# Patient Record
Sex: Female | Born: 1970 | Race: White | Hispanic: No | Marital: Married | Smoking: Never smoker
Health system: Southern US, Community
[De-identification: ages and names within clinical notes are randomized; demographics above are authoritative.]

## PROBLEM LIST (undated history)

## (undated) DIAGNOSIS — N912 Amenorrhea, unspecified: Secondary | ICD-10-CM

## (undated) DIAGNOSIS — M1990 Unspecified osteoarthritis, unspecified site: Secondary | ICD-10-CM

## (undated) DIAGNOSIS — N390 Urinary tract infection, site not specified: Secondary | ICD-10-CM

## (undated) DIAGNOSIS — J302 Other seasonal allergic rhinitis: Secondary | ICD-10-CM

## (undated) DIAGNOSIS — I809 Phlebitis and thrombophlebitis of unspecified site: Secondary | ICD-10-CM

## (undated) DIAGNOSIS — Z91018 Allergy to other foods: Secondary | ICD-10-CM

## (undated) DIAGNOSIS — F329 Major depressive disorder, single episode, unspecified: Secondary | ICD-10-CM

## (undated) DIAGNOSIS — K635 Polyp of colon: Secondary | ICD-10-CM

## (undated) DIAGNOSIS — J45909 Unspecified asthma, uncomplicated: Secondary | ICD-10-CM

## (undated) DIAGNOSIS — Z8619 Personal history of other infectious and parasitic diseases: Secondary | ICD-10-CM

## (undated) DIAGNOSIS — E079 Disorder of thyroid, unspecified: Secondary | ICD-10-CM

## (undated) DIAGNOSIS — F32A Depression, unspecified: Secondary | ICD-10-CM

## (undated) DIAGNOSIS — J301 Allergic rhinitis due to pollen: Secondary | ICD-10-CM

## (undated) DIAGNOSIS — R51 Headache: Secondary | ICD-10-CM

## (undated) DIAGNOSIS — U099 Post covid-19 condition, unspecified: Secondary | ICD-10-CM

## (undated) DIAGNOSIS — R519 Headache, unspecified: Secondary | ICD-10-CM

## (undated) HISTORY — DX: Amenorrhea, unspecified: N91.2

## (undated) HISTORY — DX: Depression, unspecified: F32.A

## (undated) HISTORY — DX: Allergy to other foods: Z91.018

## (undated) HISTORY — DX: Other seasonal allergic rhinitis: J30.2

## (undated) HISTORY — DX: Personal history of other infectious and parasitic diseases: Z86.19

## (undated) HISTORY — PX: ENDOMETRIAL ABLATION: SHX621

## (undated) HISTORY — PX: TUBAL LIGATION: SHX77

## (undated) HISTORY — PX: COLPOSCOPY: SHX161

## (undated) HISTORY — DX: Unspecified osteoarthritis, unspecified site: M19.90

## (undated) HISTORY — PX: BREAST EXCISIONAL BIOPSY: SUR124

## (undated) HISTORY — DX: Unspecified asthma, uncomplicated: J45.909

## (undated) HISTORY — DX: Polyp of colon: K63.5

## (undated) HISTORY — DX: Allergic rhinitis due to pollen: J30.1

## (undated) HISTORY — DX: Post covid-19 condition, unspecified: U09.9

## (undated) HISTORY — DX: Phlebitis and thrombophlebitis of unspecified site: I80.9

## (undated) HISTORY — DX: Headache: R51

## (undated) HISTORY — DX: Urinary tract infection, site not specified: N39.0

## (undated) HISTORY — DX: Disorder of thyroid, unspecified: E07.9

## (undated) HISTORY — DX: Headache, unspecified: R51.9

---
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
## 1993-07-11 HISTORY — PX: BREAST BIOPSY: SHX20

## 1997-07-11 DIAGNOSIS — I82401 Acute embolism and thrombosis of unspecified deep veins of right lower extremity: Secondary | ICD-10-CM

## 1997-07-11 HISTORY — DX: Acute embolism and thrombosis of unspecified deep veins of right lower extremity: I82.401

## 1998-10-16 DIAGNOSIS — I82890 Acute embolism and thrombosis of other specified veins: Secondary | ICD-10-CM | POA: Insufficient documentation

## 1998-10-16 DIAGNOSIS — N6019 Diffuse cystic mastopathy of unspecified breast: Secondary | ICD-10-CM | POA: Insufficient documentation

## 2001-07-11 HISTORY — PX: CERVICAL DISCECTOMY: SHX98

## 2011-07-12 HISTORY — PX: CHOLECYSTECTOMY: SHX55

## 2012-11-14 DIAGNOSIS — Z9889 Other specified postprocedural states: Secondary | ICD-10-CM | POA: Insufficient documentation

## 2012-11-14 DIAGNOSIS — F419 Anxiety disorder, unspecified: Secondary | ICD-10-CM | POA: Insufficient documentation

## 2012-11-14 DIAGNOSIS — G43909 Migraine, unspecified, not intractable, without status migrainosus: Secondary | ICD-10-CM | POA: Insufficient documentation

## 2014-07-03 DIAGNOSIS — F418 Other specified anxiety disorders: Secondary | ICD-10-CM | POA: Insufficient documentation

## 2014-07-03 DIAGNOSIS — M5481 Occipital neuralgia: Secondary | ICD-10-CM | POA: Insufficient documentation

## 2014-07-03 DIAGNOSIS — G252 Other specified forms of tremor: Secondary | ICD-10-CM | POA: Insufficient documentation

## 2014-07-03 DIAGNOSIS — J45909 Unspecified asthma, uncomplicated: Secondary | ICD-10-CM | POA: Insufficient documentation

## 2014-07-17 DIAGNOSIS — B0052 Herpesviral keratitis: Secondary | ICD-10-CM | POA: Insufficient documentation

## 2015-01-09 DIAGNOSIS — H16149 Punctate keratitis, unspecified eye: Secondary | ICD-10-CM | POA: Insufficient documentation

## 2015-07-09 DIAGNOSIS — Z302 Encounter for sterilization: Secondary | ICD-10-CM | POA: Insufficient documentation

## 2016-07-11 HISTORY — PX: THYROIDECTOMY: SHX17

## 2016-10-10 DIAGNOSIS — J4520 Mild intermittent asthma, uncomplicated: Secondary | ICD-10-CM | POA: Insufficient documentation

## 2016-10-10 DIAGNOSIS — G43009 Migraine without aura, not intractable, without status migrainosus: Secondary | ICD-10-CM | POA: Insufficient documentation

## 2016-10-11 LAB — BASIC METABOLIC PANEL
BUN: 12 (ref 4–21)
Creatinine: 0.8 (ref 0.5–1.1)
Glucose: 83
SODIUM: 138 (ref 137–147)

## 2016-10-11 LAB — HEPATIC FUNCTION PANEL
ALK PHOS: 64 (ref 25–125)
BILIRUBIN, TOTAL: 0.6

## 2016-10-11 LAB — TSH: TSH: 3.06 (ref 0.41–5.90)

## 2017-01-15 DIAGNOSIS — D440 Neoplasm of uncertain behavior of thyroid gland: Secondary | ICD-10-CM | POA: Insufficient documentation

## 2017-02-03 DIAGNOSIS — E038 Other specified hypothyroidism: Secondary | ICD-10-CM | POA: Insufficient documentation

## 2017-02-03 DIAGNOSIS — E041 Nontoxic single thyroid nodule: Secondary | ICD-10-CM | POA: Insufficient documentation

## 2017-08-10 LAB — CBC AND DIFFERENTIAL
Neutrophils Absolute: 5
WBC: 6.7

## 2018-04-13 ENCOUNTER — Encounter: Payer: Self-pay | Admitting: Family Medicine

## 2018-04-13 ENCOUNTER — Ambulatory Visit (INDEPENDENT_AMBULATORY_CARE_PROVIDER_SITE_OTHER): Payer: BC Managed Care – PPO | Admitting: Family Medicine

## 2018-04-13 VITALS — BP 124/74 | HR 74 | Temp 98.0°F | Ht 69.0 in | Wt 163.6 lb

## 2018-04-13 DIAGNOSIS — Z8709 Personal history of other diseases of the respiratory system: Secondary | ICD-10-CM | POA: Diagnosis not present

## 2018-04-13 DIAGNOSIS — G43909 Migraine, unspecified, not intractable, without status migrainosus: Secondary | ICD-10-CM

## 2018-04-13 DIAGNOSIS — Z23 Encounter for immunization: Secondary | ICD-10-CM

## 2018-04-13 DIAGNOSIS — F325 Major depressive disorder, single episode, in full remission: Secondary | ICD-10-CM

## 2018-04-13 DIAGNOSIS — E039 Hypothyroidism, unspecified: Secondary | ICD-10-CM | POA: Diagnosis not present

## 2018-04-13 MED ORDER — BUPROPION HCL ER (XL) 300 MG PO TB24: 300.0000 mg | ORAL_TABLET | Freq: Every day | ORAL | 5 refills | Status: DC

## 2018-04-13 MED ORDER — TOPIRAMATE 25 MG PO TABS: 25.0000 mg | ORAL_TABLET | Freq: Three times a day (TID) | ORAL | 5 refills | Status: DC

## 2018-04-13 MED ORDER — SUMATRIPTAN SUCCINATE 100 MG PO TABS: 100.0000 mg | ORAL_TABLET | Freq: Every day | ORAL | 0 refills | Status: DC | PRN

## 2018-04-13 NOTE — Assessment & Plan Note (Signed)
Stable.  Wellbutrin refilled today.

## 2018-04-13 NOTE — Assessment & Plan Note (Signed)
Stable.  Does not need refill on albuterol today.

## 2018-04-13 NOTE — Progress Notes (Signed)
Subjective:  Carrie [name] is a 47 y.o. female who presents today with a chief complaint of migraine disorder.   HPI:  Migraine disorder, new problem provider Several year history.  Currently on Topamax 25 mg 3 times daily.  Also takes Imitrex as needed.  She takes this few times per month.  Does not currently have a headache.  Symptoms seem to be well controlled.  No reported weakness or numbness.  Hypothyroidism, new problem to provider Currently takes Armour Thyroid.  This is prescribed to her by her surgeon who performed parathyroidectomy.  Symptoms seem to be well controlled.  Depression, new problem to provider Several year history.  Currently on Wellbutrin 300 mg daily.  Symptoms are well controlled.  Depression screen PHQ 2/9 04/13/2018  Decreased Interest 0  Down, Depressed, Hopeless 0  PHQ - 2 Score 0  Altered sleeping 1  Tired, decreased energy 1  Change in appetite 0  Feeling bad or failure about yourself  0  Trouble concentrating 0  Moving slowly or fidgety/restless 0  Suicidal thoughts 0  PHQ-9 Score 2  Difficult doing work/chores Not difficult at all   Asthma, chronic problem, new to provider Currently stable.  Occasionally takes albuterol as needed.  ROS: Per HPI, otherwise a complete review of systems was negative.   PMH:  The following were reviewed and entered/updated in epic: Past Medical History:  Diagnosis Date  . Arthritis   . Asthma   . Colon polyps   . Frequent headaches   . Frequent refractory urinary tract infections   . Hay fever   . History of chicken pox   . Thyroid disease    Patient Active Problem List   Diagnosis Date Noted  . Migraines 04/13/2018  . Hypothyroidism 04/13/2018  . 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 History of asthma 04/13/2018   Past Surgical History:  Procedure Laterality Date  . ADENOIDECTOMY  1975  . BREAST BIOPSY  1995  . CERVICAL DISCECTOMY  2003  . CHOLECYSTECTOMY  2013  .  THYROIDECTOMY  2018    Family History  Problem Relation Age of Onset  . Depression Mother   . Pancreatitis Mother   . Alcohol abuse Father   . Depression Father   . Diabetes Father   . Early death Father   . Heart attack Father   . Heart disease Father   . Pancreatitis Father   . Depression Sister   . Hyperlipidemia Sister   . Mental illness Sister   . Mental illness Daughter   . Migraines Daughter   . Depression Maternal Grandmother   . Early death Maternal Grandmother   . Hyperlipidemia Maternal Grandmother   . Hypertension Maternal Grandmother   . Stroke Maternal Grandmother   . Learning disabilities Maternal Grandmother   . Depression Maternal Grandfather   . Early death Maternal Grandfather   . Alcohol abuse Maternal Grandfather   . Learning disabilities Maternal Grandfather   . Epilepsy Maternal Grandfather   . Suicidality Maternal Grandfather   . Arthritis Paternal Grandmother   . Cancer Paternal Grandmother   . Hearing loss Paternal Grandmother   . Alcohol abuse Paternal Grandfather   . Hearing loss Paternal Grandfather   . Heart disease Paternal Grandfather   . Depression Sister   . Depression Sister     Medications- reviewed and updated Current Outpatient Medications  Medication Sig Dispense Refill  . ARMOUR THYROID 120 MG tablet Take 120 mg by mouth daily.  0  .  ARMOUR THYROID 15 MG tablet Take 15 mg by mouth daily.  0  . buPROPion (WELLBUTRIN XL) 300 MG 24 hr tablet Take 1 tablet (300 mg total) by mouth daily. 30 tablet 5  . Multiple Vitamin (MULTIVITAMIN) tablet Take 1 tablet by mouth daily.    . SUMAtriptan (IMITREX) 100 MG tablet Take 1 tablet (100 mg total) by mouth daily as needed. 10 tablet 0  . topiramate (TOPAMAX) 25 MG tablet Take 1 tablet (25 mg total) by mouth 3 (three) times daily. 90 tablet 5   No current facility-administered medications for this visit.     Allergies-reviewed and updated Allergies  Allergen Reactions  . Gluten Meal     . Tamiflu [Oseltamivir Phosphate]   . Zofran [Ondansetron]     Social History   Socioeconomic History  . Marital status: Not on file    Spouse name: Not on file  . Number of children: Not on file  . Years of education: Not on file  . Highest education level: Not on file  Occupational History  . Not on file  Social Needs  . Financial resource strain: Not on file  . Food insecurity:    Worry: Not on file    Inability: Not on file  . Transportation needs:    Medical: Not on file    Non-medical: Not on file  Tobacco Use  . Smoking status: Never Smoker  . Smokeless tobacco: Never Used  Substance and Sexual Activity  . Alcohol use: Yes  . Drug use: Never  . Sexual activity: Yes    Partners: Male  Lifestyle  . Physical activity:    Days per week: Not on file    Minutes per session: Not on file  . Stress: Not on file  Relationships  . Social connections:    Talks on phone: Not on file    Gets together: Not on file    Attends religious service: Not on file    Active member of club or organization: Not on file    Attends meetings of clubs or organizations: Not on file    Relationship status: Not on file  Other Topics Concern  . Not on file  Social History Narrative  . Not on file     Objective:  Physical Exam: BP 124/74 (BP Location: Left Arm, Patient Position: Sitting, Cuff Size: Normal)   Pulse 74   Temp 98 F (36.7 C) (Oral)   Ht 5' 9"  (1.753 m)   Wt 163 lb 9.6 oz (74.2 kg)   SpO2 98%   BMI 24.16 kg/m   Gen: NAD, resting comfortably CV: RRR with no murmurs appreciated Pulm: NWOB, CTAB with no crackles, wheezes, or rhonchi GI: Normal bowel sounds present. Soft, Nontender, Nondistended. MSK: No edema, cyanosis, or clubbing noted Skin: Warm, dry Neuro: Grossly normal, moves all extremities Psych: Normal affect and thought content  Assessment/Plan:  Migraines Stable.  We will refill her Topamax and Imitrex today.  Hypothyroidism Stable.  Defer further  management to her surgeon who is prescribing her Armour Thyroid.  Major depression in remission ([place]) Stable.  Wellbutrin refilled today.  History of asthma Stable.  Does not need refill on albuterol today.  Preventative Healthcare Patient was instructed to return soon for CPE.  Flu shot given today. Health Maintenance Due  Topic Date Due  . HIV Screening  03/10/1987  . TETANUS/TDAP  03/10/1991  . PAP SMEAR  03/09/1993   Caleb M. [name], MD 04/13/2018 5:13 PM

## 2018-04-13 NOTE — Assessment & Plan Note (Signed)
Stable.  We will refill her Topamax and Imitrex today.

## 2018-04-13 NOTE — Assessment & Plan Note (Signed)
Stable.  Defer further management to her surgeon who is prescribing her Armour Thyroid.

## 2018-04-13 NOTE — Patient Instructions (Signed)
It was very nice to see you today!  I will refill your wellbutrin, topamax, and imitrex.  No changes today.   We will give you your flu shot today.  Come back to see me soon for your physical with blood work.   Take care, Dr [name]

## 2018-04-20 ENCOUNTER — Ambulatory Visit (INDEPENDENT_AMBULATORY_CARE_PROVIDER_SITE_OTHER): Payer: BC Managed Care – PPO | Admitting: Family Medicine

## 2018-04-20 ENCOUNTER — Encounter: Payer: Self-pay | Admitting: Family Medicine

## 2018-04-20 VITALS — BP 120/72 | HR 78 | Temp 98.4°F | Ht 69.0 in | Wt 162.4 lb

## 2018-04-20 DIAGNOSIS — G43909 Migraine, unspecified, not intractable, without status migrainosus: Secondary | ICD-10-CM | POA: Diagnosis not present

## 2018-04-20 DIAGNOSIS — I83813 Varicose veins of bilateral lower extremities with pain: Secondary | ICD-10-CM | POA: Diagnosis not present

## 2018-04-20 DIAGNOSIS — Z0001 Encounter for general adult medical examination with abnormal findings: Secondary | ICD-10-CM | POA: Diagnosis not present

## 2018-04-20 DIAGNOSIS — Z13220 Encounter for screening for lipoid disorders: Secondary | ICD-10-CM | POA: Diagnosis not present

## 2018-04-20 LAB — LIPID PANEL
CHOLESTEROL: 189 mg/dL (ref <200)
HDL: 52 mg/dL (ref >50)
LDL Cholesterol (Calc): 120 mg/dL (calc) — ABNORMAL HIGH
NON-HDL CHOLESTEROL (CALC): 137 mg/dL — AB (ref <130)
Total CHOL/HDL Ratio: 3.6 (calc) (ref <5.0)
Triglycerides: 71 mg/dL (ref <150)

## 2018-04-20 LAB — COMPREHENSIVE METABOLIC PANEL
AG RATIO: 1.9 (calc) (ref 1.0–2.5)
ALKALINE PHOSPHATASE (APISO): 74 U/L (ref 33–115)
ALT: 18 U/L (ref 6–29)
AST: 14 U/L (ref 10–35)
Albumin: 4.1 g/dL (ref 3.6–5.1)
BUN: 18 mg/dL (ref 7–25)
CHLORIDE: 107 mmol/L (ref 98–110)
CO2: 24 mmol/L (ref 20–32)
Calcium: 8.8 mg/dL (ref 8.6–10.2)
Creat: 0.87 mg/dL (ref 0.50–1.10)
GLOBULIN: 2.2 g/dL (ref 1.9–3.7)
Glucose, Bld: 80 mg/dL (ref 65–99)
POTASSIUM: 4.2 mmol/L (ref 3.5–5.3)
Sodium: 138 mmol/L (ref 135–146)
Total Bilirubin: 0.7 mg/dL (ref 0.2–1.2)
Total Protein: 6.3 g/dL (ref 6.1–8.1)

## 2018-04-20 LAB — CBC
HEMATOCRIT: 40.6 % (ref 35.0–45.0)
HEMOGLOBIN: 14 g/dL (ref 11.7–15.5)
MCH: 31.2 pg (ref 27.0–33.0)
MCHC: 34.5 g/dL (ref 32.0–36.0)
MCV: 90.4 fL (ref 80.0–100.0)
MPV: 9.8 fL (ref 7.5–12.5)
Platelets: 277 10*3/uL (ref 140–400)
RBC: 4.49 10*6/uL (ref 3.80–5.10)
RDW: 11.8 % (ref 11.0–15.0)
WBC: 5.2 10*3/uL (ref 3.8–10.8)

## 2018-04-20 NOTE — Progress Notes (Signed)
Subjective:  Carrie [name] is a 47 y.o. female who presents today for her annual comprehensive physical exam.    HPI:  She has no acute complaints today.   Her chronic, stable conditions are outlined below:  1. Migraines.  Stable on Topamax 25 mg 3 times daily.  Also takes Imitrex as needed. 2.  Depression.  Stable on Wellbutrin 300 mg daily. 3.  Varicose veins.  Several year history.  Worse with standing.  Would like referral for further evaluation today.  Occasionally painful.  Lifestyle Diet: No specific diets. Tries to avoid gluten.  Exercise: Does yoga every day. Likes to hike and run.   Depression screen PHQ 2/9 04/13/2018  Decreased Interest 0  Down, Depressed, Hopeless 0  PHQ - 2 Score 0  Altered sleeping 1  Tired, decreased energy 1  Change in appetite 0  Feeling bad or failure about yourself  0  Trouble concentrating 0  Moving slowly or fidgety/restless 0  Suicidal thoughts 0  PHQ-9 Score 2  Difficult doing work/chores Not difficult at all    Health Maintenance Due  Topic Date Due  . HIV Screening  03/10/1987  . TETANUS/TDAP  03/10/1991  . PAP SMEAR  03/09/1993     ROS: Per HPI, otherwise a complete review of systems was negative.   PMH:  The following were reviewed and entered/updated in epic: Past Medical History:  Diagnosis Date  . Arthritis   . Asthma   . Colon polyps   . Frequent headaches   . Frequent refractory urinary tract infections   . Hay fever   . History of chicken pox   . Thyroid disease    Patient Active Problem List   Diagnosis Date Noted  . Varicose veins of both lower extremities with pain 04/20/2018  . Migraines 04/13/2018  . Hypothyroidism 04/13/2018  . 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 History of asthma 04/13/2018   Past Surgical History:  Procedure Laterality Date  . ADENOIDECTOMY  1975  . BREAST BIOPSY  1995  . CERVICAL DISCECTOMY  2003  . CHOLECYSTECTOMY  2013  . THYROIDECTOMY  2018     Family History  Problem Relation Age of Onset  . Depression Mother   . Pancreatitis Mother   . Alcohol abuse Father   . Depression Father   . Diabetes Father   . Early death Father   . Heart attack Father   . Heart disease Father   . Pancreatitis Father   . Depression Sister   . Hyperlipidemia Sister   . Mental illness Sister   . Mental illness Daughter   . Migraines Daughter   . Depression Maternal Grandmother   . Early death Maternal Grandmother   . Hyperlipidemia Maternal Grandmother   . Hypertension Maternal Grandmother   . Stroke Maternal Grandmother   . Learning disabilities Maternal Grandmother   . Depression Maternal Grandfather   . Early death Maternal Grandfather   . Alcohol abuse Maternal Grandfather   . Learning disabilities Maternal Grandfather   . Epilepsy Maternal Grandfather   . Suicidality Maternal Grandfather   . Arthritis Paternal Grandmother   . Cancer Paternal Grandmother   . Hearing loss Paternal Grandmother   . Alcohol abuse Paternal Grandfather   . Hearing loss Paternal Grandfather   . Heart disease Paternal Grandfather   . Depression Sister   . Depression Sister     Medications- reviewed and updated Current Outpatient Medications  Medication Sig Dispense Refill  . ARMOUR THYROID 120  MG tablet Take 120 mg by mouth daily.  0  . ARMOUR THYROID 15 MG tablet Take 15 mg by mouth daily.  0  . buPROPion (WELLBUTRIN XL) 300 MG 24 hr tablet Take 1 tablet (300 mg total) by mouth daily. 30 tablet 5  . Multiple Vitamin (MULTIVITAMIN) tablet Take 1 tablet by mouth daily.    . SUMAtriptan (IMITREX) 100 MG tablet Take 1 tablet (100 mg total) by mouth daily as needed. 10 tablet 0  . topiramate (TOPAMAX) 25 MG tablet Take 1 tablet (25 mg total) by mouth 3 (three) times daily. 90 tablet 5   No current facility-administered medications for this visit.     Allergies-reviewed and updated Allergies  Allergen Reactions  . Gluten Meal   . Tamiflu  [Oseltamivir Phosphate]   . Zofran [Ondansetron]     Social History   Socioeconomic History  . Marital status: Married    Spouse name: Not on file  . Number of children: Not on file  . Years of education: Not on file  . Highest education level: Not on file  Occupational History  . Not on file  Social Needs  . Financial resource strain: Not on file  . Food insecurity:    Worry: Not on file    Inability: Not on file  . Transportation needs:    Medical: Not on file    Non-medical: Not on file  Tobacco Use  . Smoking status: Never Smoker  . Smokeless tobacco: Never Used  Substance and Sexual Activity  . Alcohol use: Yes  . Drug use: Never  . Sexual activity: Yes    Partners: Male  Lifestyle  . Physical activity:    Days per week: Not on file    Minutes per session: Not on file  . Stress: Not on file  Relationships  . Social connections:    Talks on phone: Not on file    Gets together: Not on file    Attends religious service: Not on file    Active member of club or organization: Not on file    Attends meetings of clubs or organizations: Not on file    Relationship status: Not on file  Other Topics Concern  . Not on file  Social History Narrative  . Not on file    Objective:  Physical Exam: BP 120/72 (BP Location: Left Arm, Patient Position: Sitting, Cuff Size: Normal)   Pulse 78   Temp 98.4 F (36.9 C) (Oral)   Ht 5' 9"  (1.753 m)   Wt 162 lb 6.4 oz (73.7 kg)   SpO2 98%   BMI 23.98 kg/m   Body mass index is 23.98 kg/m. Wt Readings from Last 3 Encounters:  04/20/18 162 lb 6.4 oz (73.7 kg)  04/13/18 163 lb 9.6 oz (74.2 kg)   Gen: NAD, resting comfortably HEENT: TMs normal bilaterally. OP clear. No thyromegaly noted.  CV: RRR with no murmurs appreciated Pulm: NWOB, CTAB with no crackles, wheezes, or rhonchi GI: Normal bowel sounds present. Soft, Nontender, Nondistended. MSK: no edema, cyanosis, or clubbing noted.  Varicose veins and spider veins noted  bilateral lower extremities. Skin: warm, dry Neuro: CN2-12 grossly intact. Strength 5/5 in upper and lower extremities. Reflexes symmetric and intact bilaterally.  Psych: Normal affect and thought content  Assessment/Plan:  Migraines Stable.  Check CBC and CMET.  Continue Topamax and Imitrex.  Varicose veins of both lower extremities with pain Refer to vascular surgery.  Preventative Healthcare: Check lipid panel.  Up-to-date on  Pap smear-obtain these records.  Referral to GYN placed.  Patient Counseling(The following topics were reviewed and/or handout was given):  -Nutrition: Stressed importance of moderation in sodium/caffeine intake, saturated fat and cholesterol, caloric balance, sufficient intake of fresh fruits, vegetables, and fiber.  -Stressed the importance of regular exercise.   -Substance Abuse: Discussed cessation/primary prevention of tobacco, alcohol, or other drug use; driving or other dangerous activities under the influence; availability of treatment for abuse.   -Injury prevention: Discussed safety belts, safety helmets, smoke detector, smoking near bedding or upholstery.   -Sexuality: Discussed sexually transmitted diseases, partner selection, use of condoms, avoidance of unintended pregnancy and contraceptive alternatives.   -Dental health: Discussed importance of regular tooth brushing, flossing, and dental visits.  -Health maintenance and immunizations reviewed. Please refer to Health maintenance section.  Return to care in 1 year for next preventative visit.   [name]. [name], MD 04/20/2018 4:59 PM

## 2018-04-20 NOTE — Patient Instructions (Signed)
It was very nice to see you today!  Keep up the good work!  We will check blood work today.  I will send in a referral for gynecology and vascular surgery.  Come back to see me in 1 year, or sooner as needed.   Take care, Dr [name]   Preventive Care 46-64 Years, Female Preventive care refers to lifestyle choices and visits with your health care provider that can promote health and wellness. What does preventive care include?  A yearly physical exam. This is also called an annual well check.  Dental exams once or twice a year.  Routine eye exams. Ask your health care provider how often you should have your eyes checked.  Personal lifestyle choices, including: ? Daily care of your teeth and gums. ? Regular physical activity. ? Eating a healthy diet. ? Avoiding tobacco and drug use. ? Limiting alcohol use. ? Practicing safe sex. ? Taking low-dose aspirin daily starting at age 1. ? Taking vitamin and mineral supplements as recommended by your health care provider. What happens during an annual well check? The services and screenings done by your health care provider during your annual well check will depend on your age, overall health, lifestyle risk factors, and family history of disease. Counseling Your health care provider may ask you questions about your:  Alcohol use.  Tobacco use.  Drug use.  Emotional well-being.  Home and relationship well-being.  Sexual activity.  Eating habits.  Work and work Statistician.  Method of birth control.  Menstrual cycle.  Pregnancy history.  Screening You may have the following tests or measurements:  Height, weight, and BMI.  Blood pressure.  Lipid and cholesterol levels. These may be checked every 5 years, or more frequently if you are over 67 years old.  Skin check.  Lung cancer screening. You may have this screening every year starting at age 46 if you have a 30-pack-year history of smoking and currently  smoke or have quit within the past 15 years.  Fecal occult blood test (FOBT) of the stool. You may have this test every year starting at age 46.  Flexible sigmoidoscopy or colonoscopy. You may have a sigmoidoscopy every 5 years or a colonoscopy every 10 years starting at age 46.  Hepatitis C blood test.  Hepatitis B blood test.  Sexually transmitted disease (STD) testing.  Diabetes screening. This is done by checking your blood sugar (glucose) after you have not eaten for a while (fasting). You may have this done every 1-3 years.  Mammogram. This may be done every 1-2 years. Talk to your health care provider about when you should start having regular mammograms. This may depend on whether you have a family history of breast cancer.  BRCA-related cancer screening. This may be done if you have a family history of breast, ovarian, tubal, or peritoneal cancers.  Pelvic exam and Pap test. This may be done every 3 years starting at age 46. Starting at age 48, this may be done every 5 years if you have a Pap test in combination with an HPV test.  Bone density scan. This is done to screen for osteoporosis. You may have this scan if you are at high risk for osteoporosis.  Discuss your test results, treatment options, and if necessary, the need for more tests with your health care provider. Vaccines Your health care provider may recommend certain vaccines, such as:  Influenza vaccine. This is recommended every year.  Tetanus, diphtheria, and acellular pertussis (Tdap, Td)  vaccine. You may need a Td booster every 10 years.  Varicella vaccine. You may need this if you have not been vaccinated.  Zoster vaccine. You may need this after age 46.  Measles, mumps, and rubella (MMR) vaccine. You may need at least one dose of MMR if you were born in 1957 or later. You may also need a second dose.  Pneumococcal 13-valent conjugate (PCV13) vaccine. You may need this if you have certain conditions and  were not previously vaccinated.  Pneumococcal polysaccharide (PPSV23) vaccine. You may need one or two doses if you smoke cigarettes or if you have certain conditions.  Meningococcal vaccine. You may need this if you have certain conditions.  Hepatitis A vaccine. You may need this if you have certain conditions or if you travel or work in places where you may be exposed to hepatitis A.  Hepatitis B vaccine. You may need this if you have certain conditions or if you travel or work in places where you may be exposed to hepatitis B.  Haemophilus influenzae type b (Hib) vaccine. You may need this if you have certain conditions.  Talk to your health care provider about which screenings and vaccines you need and how often you need them. This information is not intended to replace advice given to you by your health care provider. Make sure you discuss any questions you have with your health care provider. Document Released: 07/24/2015 Document Revised: 03/16/2016 Document Reviewed: 04/28/2015 Elsevier Interactive Patient Education  [employer].

## 2018-04-20 NOTE — Assessment & Plan Note (Signed)
Refer to vascular surgery.

## 2018-04-20 NOTE — Assessment & Plan Note (Addendum)
Stable.  Check CBC and CMET.  Continue Topamax and Imitrex.

## 2018-04-23 NOTE — Progress Notes (Signed)
Please inform patient of the following:  Blood counts are normal.  Electrolytes, kidney function, liver function, and blood sugar levels are normal. Her cholesterol is a little high. We do not need to start medications based on this, but she should continue working on diet and exercise and we can recheck in 1 year.   [name]. [name], MD 04/23/2018 8:39 AM

## 2018-05-17 ENCOUNTER — Encounter: Payer: Self-pay | Admitting: Physical Therapy

## 2018-06-05 ENCOUNTER — Other Ambulatory Visit: Payer: Self-pay | Admitting: Family Medicine

## 2018-06-05 MED ORDER — SUMATRIPTAN SUCCINATE 100 MG PO TABS: 100.0000 mg | ORAL_TABLET | Freq: Every day | ORAL | 0 refills | Status: AC | PRN

## 2018-07-05 ENCOUNTER — Other Ambulatory Visit: Payer: Self-pay

## 2018-07-05 ENCOUNTER — Encounter: Payer: Self-pay | Admitting: Family Medicine

## 2018-07-05 DIAGNOSIS — I83813 Varicose veins of bilateral lower extremities with pain: Secondary | ICD-10-CM

## 2018-07-06 ENCOUNTER — Other Ambulatory Visit: Payer: Self-pay

## 2018-07-06 MED ORDER — BUPROPION HCL ER (XL) 150 MG PO TB24: 150.0000 mg | ORAL_TABLET | Freq: Every day | ORAL | 0 refills | Status: DC

## 2018-07-09 ENCOUNTER — Other Ambulatory Visit: Payer: Self-pay

## 2018-07-09 DIAGNOSIS — G43909 Migraine, unspecified, not intractable, without status migrainosus: Secondary | ICD-10-CM

## 2018-07-20 ENCOUNTER — Ambulatory Visit (HOSPITAL_COMMUNITY)
Admission: RE | Admit: 2018-07-20 | Discharge: 2018-07-20 | Disposition: A | Discharge status: Home / Self Care | Payer: BC Managed Care – PPO | Source: Ambulatory Visit | Attending: Vascular Surgery | Admitting: Vascular Surgery

## 2018-07-20 ENCOUNTER — Ambulatory Visit (INDEPENDENT_AMBULATORY_CARE_PROVIDER_SITE_OTHER): Payer: BC Managed Care – PPO | Admitting: Vascular Surgery

## 2018-07-20 ENCOUNTER — Other Ambulatory Visit: Payer: Self-pay

## 2018-07-20 ENCOUNTER — Encounter: Payer: Self-pay | Admitting: Vascular Surgery

## 2018-07-20 VITALS — BP 110/70 | HR 71 | Temp 97.3°F | Resp 20 | Ht 69.0 in | Wt 162.0 lb

## 2018-07-20 DIAGNOSIS — I872 Venous insufficiency (chronic) (peripheral): Secondary | ICD-10-CM | POA: Diagnosis not present

## 2018-07-20 DIAGNOSIS — I83813 Varicose veins of bilateral lower extremities with pain: Secondary | ICD-10-CM | POA: Diagnosis present

## 2018-07-20 NOTE — Progress Notes (Signed)
Patient ID: Carrie [name], female   DOB: 12-17-1970, 48 y.o.   MRN: [id number]  Reason for Consult: No chief complaint on file.   Referred by Vivi Barrack, MD  Subjective:     HPI:  Carrie [name] is a 48 y.o. female presents for evaluation of bilateral lower extremity swelling with associated lower left leg varicose veins.  She has previously had an intervention in [place] of her right greater saphenous vein as well as varicosities there.  She has been unhappy given persistent swelling.  She did wear compression stockings has not worn them lately.  She still owns thigh-high compression stockings and knows how to wear them and the indications.  She does have a history of a right lower extremity DVT at the time of birth.  States that her varicosities worsened after each pregnancy.  She does not have any bleeding from her varicosities.  She also has bilateral spider veins.  Swelling is her chief complaint.  Past Medical History:  Diagnosis Date  . Arthritis   . Asthma   . Colon polyps   . Frequent headaches   . Frequent refractory urinary tract infections   . Hay fever   . History of chicken pox   . Thyroid disease    Family History  Problem Relation Age of Onset  . Depression Mother   . Pancreatitis Mother   . Alcohol abuse Father   . Depression Father   . Diabetes Father   . Early death Father   . Heart attack Father   . Heart disease Father   . Pancreatitis Father   . Depression Sister   . Hyperlipidemia Sister   . Mental illness Sister   . Mental illness Daughter   . Migraines Daughter   . Depression Maternal Grandmother   . Early death Maternal Grandmother   . Hyperlipidemia Maternal Grandmother   . Hypertension Maternal Grandmother   . Stroke Maternal Grandmother   . Learning disabilities Maternal Grandmother   . Depression Maternal Grandfather   . Early death Maternal Grandfather   . Alcohol abuse Maternal Grandfather   . Learning disabilities Maternal  Grandfather   . Epilepsy Maternal Grandfather   . Suicidality Maternal Grandfather   . Arthritis Paternal Grandmother   . Cancer Paternal Grandmother   . Hearing loss Paternal Grandmother   . Alcohol abuse Paternal Grandfather   . Hearing loss Paternal Grandfather   . Heart disease Paternal Grandfather   . Depression Sister   . Depression Sister    Past Surgical History:  Procedure Laterality Date  . ADENOIDECTOMY  1975  . BREAST BIOPSY  1995  . CERVICAL DISCECTOMY  2003  . CHOLECYSTECTOMY  2013  . THYROIDECTOMY  2018    Short Social History:  Social History   Tobacco Use  . Smoking status: Never Smoker  . Smokeless tobacco: Never Used  Substance Use Topics  . Alcohol use: Yes    Allergies  Allergen Reactions  . Gluten Meal   . Tamiflu [Oseltamivir Phosphate]   . Zofran [Ondansetron]     Current Outpatient Medications  Medication Sig Dispense Refill  . ARMOUR THYROID 120 MG tablet Take 120 mg by mouth daily.  0  . ARMOUR THYROID 15 MG tablet Take 15 mg by mouth daily.  0  . buPROPion (WELLBUTRIN XL) 150 MG 24 hr tablet Take 1 tablet (150 mg total) by mouth daily. 30 tablet 0  . Multiple Vitamin (MULTIVITAMIN) tablet Take 1 tablet by mouth daily.    Marland Kitchen  SUMAtriptan (IMITREX) 100 MG tablet Take 1 tablet (100 mg total) by mouth daily as needed. 10 tablet 0  . topiramate (TOPAMAX) 25 MG tablet Take 1 tablet (25 mg total) by mouth 3 (three) times daily. 90 tablet 5   No current facility-administered medications for this visit.     Review of Systems  Constitutional:  Constitutional negative. HENT: HENT negative.  Eyes: Eyes negative.  Respiratory: Respiratory negative.  Cardiovascular: Positive for leg swelling.  GI: Gastrointestinal negative.  Musculoskeletal: Positive for leg pain.  Skin: Skin negative.  Neurological: Neurological negative. Hematologic: Hematologic/lymphatic negative.  Psychiatric: Psychiatric negative.        Objective:  Objective    There were no vitals filed for this visit. There is no height or weight on file to calculate BMI.  Physical Exam HENT:     Head: Normocephalic.  Eyes:     Pupils: Pupils are equal, round, and reactive to light.  Neck:     Musculoskeletal: Normal range of motion.  Cardiovascular:     Rate and Rhythm: Normal rate and regular rhythm.     Pulses: Normal pulses.  Abdominal:     General: Abdomen is flat.  Musculoskeletal: Normal range of motion.        General: Swelling present.     Comments: Varicosities chiefly located posterior left thigh Spider veins throughout bilateral thighs  Skin:    General: Skin is warm and dry.     Capillary Refill: Capillary refill takes less than 2 seconds.  Neurological:     General: No focal deficit present.     Mental Status: She is alert.  Psychiatric:        Mood and Affect: Mood normal.        Behavior: Behavior normal.        Thought Content: Thought content normal.        Judgment: Judgment normal.     Data: I have independently interpreted her lower extremity venous reflux study which demonstrates reflux at the greater saphenous vein on the left at the 6161 ms.  The bilateral small saphenous veins have reflux and measure on the right 0.5 cm on the left 0.94 cm.  Diameter the greater saphenous veins at the junction of 0.83 cm and 0.76 cm right and left respectively.     Assessment/Plan:     48 year old female with C3 chronic venous insufficiency with previous ablation of her right greater saphenous vein and now with reflux of her left greater saphenous vein as well as fairly large and refluxing small saphenous veins bilaterally which may be contributing to her edema.  She also has varicosities on her posterior left thigh which give her discomfort and unsightly spider veins which she is mostly not worried about.  She will begin wearing her compression stockings again and follow-up in 3 months for possible saphenous vein ablation and stab  phlebectomy of her left thigh.  Given that she has been through this before she does understand all of her symptoms may not be relieved but that she may get some relief with intervention.     Waynetta Sandy MD [facility]

## 2018-07-29 ENCOUNTER — Other Ambulatory Visit: Payer: Self-pay | Admitting: Family Medicine

## 2018-09-03 ENCOUNTER — Ambulatory Visit: Payer: BC Managed Care – PPO | Admitting: Family Medicine

## 2018-09-03 ENCOUNTER — Encounter: Payer: Self-pay | Admitting: Family Medicine

## 2018-09-03 VITALS — BP 108/64 | HR 83 | Temp 98.3°F | Ht 69.0 in | Wt 170.4 lb

## 2018-09-03 DIAGNOSIS — F325 Major depressive disorder, single episode, in full remission: Secondary | ICD-10-CM | POA: Diagnosis not present

## 2018-09-03 DIAGNOSIS — Z6825 Body mass index (BMI) 25.0-25.9, adult: Secondary | ICD-10-CM

## 2018-09-03 DIAGNOSIS — L600 Ingrowing nail: Secondary | ICD-10-CM

## 2018-09-03 DIAGNOSIS — K5900 Constipation, unspecified: Secondary | ICD-10-CM | POA: Insufficient documentation

## 2018-09-03 MED ORDER — LINACLOTIDE 145 MCG PO CAPS: 145.0000 ug | ORAL_CAPSULE | Freq: Every day | ORAL | 3 refills | Status: DC

## 2018-09-03 MED ORDER — BUPROPION HCL ER (XL) 300 MG PO TB24: 300.0000 mg | ORAL_TABLET | Freq: Every day | ORAL | 3 refills | Status: DC

## 2018-09-03 NOTE — Assessment & Plan Note (Signed)
Possible IBS constipation type.  Reportedly had recent thyroid testing which was normal.  She has tried MiraLAX in the past and has not done well.  We will start Linzess 145 mcg daily.  Also recommended IBgard.  Encouraged good oral hydration and diet high in fiber.  If symptoms do not improve, would consider referral to GI.

## 2018-09-03 NOTE — Patient Instructions (Signed)
It was very nice to see you today!  We will restart Wellbutrin.  Please let me know if any side effects.  Please try the Linzess.  Please also try the IBgard.  Make sure that you are getting plenty of fluids and plenty of fiber in your diet.  Let me know if your symptoms do not improving and will refer you to GI.  Please continue with Epson salt soaks for your toe.  Please come back here if you would like to have a partial nail removal.  I can also send you to a foot doctor if you desire permanent removal.  Take care, Dr [name]   Ingrown Toenail An ingrown toenail occurs when the corner or sides of a toenail grow into the surrounding skin. This causes discomfort and pain. The big toe is most commonly affected, but any of the toes can be affected. If an ingrown toenail is not treated, it can become infected. What are the causes? This condition may be caused by:  Wearing shoes that are too small or tight.  An injury, such as stubbing your toe or having your toe stepped on.  Improper cutting or care of your toenails.  Having nail or foot abnormalities that were present from birth (congenital abnormalities), such as having a nail that is too big for your toe. What increases the risk? The following factors may make you more likely to develop ingrown toenails:  Age. Nails tend to get thicker with age, so ingrown nails are more common among older people.  Cutting your toenails incorrectly, such as cutting them very short or cutting them unevenly. An ingrown toenail is more likely to get infected if you have:  Diabetes.  Blood flow (circulation) problems. What are the signs or symptoms? Symptoms of an ingrown toenail may include:  Pain, soreness, or tenderness.  Redness.  Swelling.  Hardening of the skin that surrounds the toenail. Signs that an ingrown toenail may be infected include:  Fluid or pus.  Symptoms that get worse instead of better. How is this diagnosed? An  ingrown toenail may be diagnosed based on your medical history, your symptoms, and a physical exam. If you have fluid or blood coming from your toenail, a sample may be collected to test for the specific type of bacteria that is causing the infection. How is this treated? Treatment depends on how severe your ingrown toenail is. You may be able to care for your toenail at home.  If you have an infection, you may be prescribed antibiotic medicines.  If you have fluid or pus draining from your toenail, your health care provider may drain it.  If you have trouble walking, you may be given crutches to use.  If you have a severe or infected ingrown toenail, you may need a procedure to remove part or all of the nail. Follow these instructions at home: Foot care   Do not pick at your toenail or try to remove it yourself.  Soak your foot in warm, soapy water. Do this for 20 minutes, 3 times a day, or as often as told by your health care provider. This helps to keep your toe clean and keep your skin soft.  Wear shoes that fit well and are not too tight. Your health care provider may recommend that you wear open-toed shoes while you heal.  Trim your toenails regularly and carefully. Cut your toenails straight across to prevent injury to the skin at the corners of the toenail. Do not  cut your nails in a curved shape.  Keep your feet clean and dry to help prevent infection. Medicines  Take over-the-counter and prescription medicines only as told by your health care provider.  If you were prescribed an antibiotic, take it as told by your health care provider. Do not stop taking the antibiotic even if you start to feel better. Activity  Return to your normal activities as told by your health care provider. Ask your health care provider what activities are safe for you.  Avoid activities that cause pain. General instructions  If your health care provider told you to use crutches to help you move  around, use them as instructed.  Keep all follow-up visits as told by your health care provider. This is important. Contact a health care provider if:  You have more redness, swelling, pain, or other symptoms that do not improve with treatment.  You have fluid, blood, or pus coming from your toenail. Get help right away if:  You have a red streak on your skin that starts at your foot and spreads up your leg.  You have a fever. Summary  An ingrown toenail occurs when the corner or sides of a toenail grow into the surrounding skin. This causes discomfort and pain. The big toe is most commonly affected, but any of the toes can be affected.  If an ingrown toenail is not treated, it can become infected.  Fluid or pus draining from your toenail is a sign of infection. Your health care provider may need to drain it. You may be given antibiotics to treat the infection.  Trimming your toenails regularly and properly can help you prevent an ingrown toenail. This information is not intended to replace advice given to you by your health care provider. Make sure you discuss any questions you have with your health care provider. Document Released: 06/24/2000 Document Revised: 03/15/2017 Document Reviewed: 03/15/2017 Elsevier Interactive Patient Education  2019 [employer].

## 2018-09-03 NOTE — Assessment & Plan Note (Signed)
Failed paxil and effexor in the past due to side effects.

## 2018-09-03 NOTE — Progress Notes (Signed)
   Chief Complaint:  Carrie [name] is a 48 y.o. female who presents today with a chief complaint of depression follow-up.   Assessment/Plan:  Ingrown toenail No signs of infection.  Recommended continuing Epson salt soaks.  Also recommend placing a small amount of floss under distal edge of nail.  She will follow-up with me in a few weeks if she wishes to have partial nail removal.  Discussed reasons to return to care.  Major depression in remission ([place]) Failed paxil and effexor in the past due to side effects.   Constipation Possible IBS constipation type.  Reportedly had recent thyroid testing which was normal.  She has tried MiraLAX in the past and has not done well.  We will start Linzess 145 mcg daily.  Also recommended IBgard.  Encouraged good oral hydration and diet high in fiber.  If symptoms do not improve, would consider referral to GI.  BMI >25 Continue lifestyle modifications.     Subjective:  HPI:  Constipation Patient has had several year history of constipation since childhood.  She has been evaluated by several physicians in the past who recommended MiraLAX.  This is never seemed to help significantly.  She has intermittent diarrhea.  She has associated abdominal pain and bloating.  Symptoms seem to improve with bowel movement.  No clear precipitating or triggering factors.  No reported hematochezia or melena.  No other treatments tried.  Ingrown toe Started several weeks ago.  Located on right great toe.  Has tried Epson salt soaks with no improvement.  Area is very painful to palpation.  No drainage.  # Depression - Not currently on medications but has done well with Wellbutrin in the past. -She is interested in restarting Wellbutrin today.  Depression screen PHQ 2/9 09/03/2018  Decreased Interest 1  Down, Depressed, Hopeless 1  PHQ - 2 Score 2  Altered sleeping 2  Tired, decreased energy 2  Change in appetite 2  Feeling bad or failure about yourself  0    Trouble concentrating 1  Moving slowly or fidgety/restless 1  Suicidal thoughts 0  PHQ-9 Score 10  Difficult doing work/chores Somewhat difficult    ROS: Per HPI  PMH: She reports that she has never smoked. She has never used smokeless tobacco. She reports current alcohol use. She reports that she does not use drugs.      Objective:  Physical Exam: BP 108/64 (BP Location: Left Arm, Patient Position: Sitting, Cuff Size: Normal)   Pulse 83   Temp 98.3 F (36.8 C) (Oral)   Ht 5' 9"  (1.753 m)   Wt 170 lb 6.1 oz (77.3 kg)   SpO2 99%   BMI 25.16 kg/m   Gen: NAD, resting comfortably MSK: -Right foot: Ingrown toenail noted at lateral edge of right great toe.  Small amount of surrounding erythema.  No purulent drainage.  No fluctuance.     [name]. [name], MD 09/03/2018 12:30 PM

## 2018-09-17 ENCOUNTER — Encounter: Payer: Self-pay | Admitting: Family Medicine

## 2018-09-25 ENCOUNTER — Encounter: Payer: Self-pay | Admitting: Family Medicine

## 2018-09-26 ENCOUNTER — Other Ambulatory Visit: Payer: Self-pay

## 2018-09-26 MED ORDER — LINACLOTIDE 145 MCG PO CAPS: 290.0000 ug | ORAL_CAPSULE | Freq: Every day | ORAL | 3 refills | Status: DC

## 2018-09-30 ENCOUNTER — Encounter: Payer: Self-pay | Admitting: Family Medicine

## 2018-10-10 ENCOUNTER — Ambulatory Visit: Payer: BC Managed Care – PPO | Admitting: Vascular Surgery

## 2018-10-19 ENCOUNTER — Other Ambulatory Visit: Payer: Self-pay | Admitting: Family Medicine

## 2018-11-27 ENCOUNTER — Encounter: Payer: Self-pay | Admitting: Family Medicine

## 2018-11-27 NOTE — Telephone Encounter (Signed)
Please see message and advise 

## 2018-11-28 ENCOUNTER — Other Ambulatory Visit: Payer: Self-pay

## 2018-11-28 DIAGNOSIS — E039 Hypothyroidism, unspecified: Secondary | ICD-10-CM

## 2018-12-17 ENCOUNTER — Telehealth: Payer: Self-pay | Admitting: *Deleted

## 2018-12-19 ENCOUNTER — Other Ambulatory Visit: Payer: Self-pay

## 2018-12-19 ENCOUNTER — Ambulatory Visit (INDEPENDENT_AMBULATORY_CARE_PROVIDER_SITE_OTHER): Payer: BC Managed Care – PPO | Admitting: Vascular Surgery

## 2018-12-19 ENCOUNTER — Encounter: Payer: Self-pay | Admitting: Vascular Surgery

## 2018-12-19 VITALS — Temp 97.6°F

## 2018-12-19 DIAGNOSIS — I83813 Varicose veins of bilateral lower extremities with pain: Secondary | ICD-10-CM | POA: Diagnosis not present

## 2018-12-19 NOTE — Progress Notes (Signed)
Patient name: Carrie [name] MRN: [id number] DOB: [birth year] Sex: female     Virtual Visit via Video Note   I connected with Ruby Cola on 12/19/2018 using the [employer] virtual platform and verified that I was speaking with the correct person using two identifiers. Patient was located at her home and accompanied by no one. I am located at our office on [facility].   The limitations of evaluation and management by telemedicine and the availability of in person appointments have been previously discussed with the patient and are documented in the patients chart. The patient expressed understanding and consented to proceed.   PCP: Vivi Barrack, MD   REASON FOR CONSULT: Symptomatic varicose veins with pain  HPI: Carrie [name] is a 48 y.o. female, with a 7 to 8-year history of bilateral lower extremity varicose veins.  He had some type of stripping and sclerotherapy performed about 7 years ago.  She states that her symptoms did not really improve.  She does have multiple links of compression stockings that she wears frequently.  These do not completely provide relief.  She does have a history of a right popliteal DVT in the remote past.  She took anticoagulation briefly but has now had this discontinued.  She states that she developed varicose veins after her pregnancy.  Left leg has always been worse than the right.  She denies family history of varicose veins.     Past Medical History:  Diagnosis Date  . Arthritis   . Asthma   . Colon polyps   . Frequent headaches   . Frequent refractory urinary tract infections   . Hay fever   . History of chicken pox   . Thyroid disease    Past Surgical History:  Procedure Laterality Date  . ADENOIDECTOMY  1975  . BREAST BIOPSY  1995  . CERVICAL DISCECTOMY  2003  . CHOLECYSTECTOMY  2013  . THYROIDECTOMY  2018    Family History  Problem Relation Age of Onset  . Depression Mother   . Pancreatitis Mother   . Alcohol abuse  Father   . Depression Father   . Diabetes Father   . Early death Father   . Heart attack Father   . Heart disease Father   . Pancreatitis Father   . Depression Sister   . Hyperlipidemia Sister   . Mental illness Sister   . Mental illness Daughter   . Migraines Daughter   . Depression Maternal Grandmother   . Early death Maternal Grandmother   . Hyperlipidemia Maternal Grandmother   . Hypertension Maternal Grandmother   . Stroke Maternal Grandmother   . Learning disabilities Maternal Grandmother   . Depression Maternal Grandfather   . Early death Maternal Grandfather   . Alcohol abuse Maternal Grandfather   . Learning disabilities Maternal Grandfather   . Epilepsy Maternal Grandfather   . Suicidality Maternal Grandfather   . Arthritis Paternal Grandmother   . Cancer Paternal Grandmother   . Hearing loss Paternal Grandmother   . Alcohol abuse Paternal Grandfather   . Hearing loss Paternal Grandfather   . Heart disease Paternal Grandfather   . Depression Sister   . Depression Sister     SOCIAL HISTORY: Social History   Socioeconomic History  . Marital status: Married    Spouse name: Not on file  . Number of children: Not on file  . Years of education: Not on file  . Highest education level: Not on file  Occupational History  .  Not on file  Social Needs  . Financial resource strain: Not on file  . Food insecurity:    Worry: Not on file    Inability: Not on file  . Transportation needs:    Medical: Not on file    Non-medical: Not on file  Tobacco Use  . Smoking status: Never Smoker  . Smokeless tobacco: Never Used  Substance and Sexual Activity  . Alcohol use: Yes  . Drug use: Never  . Sexual activity: Yes    Partners: Male  Lifestyle  . Physical activity:    Days per week: Not on file    Minutes per session: Not on file  . Stress: Not on file  Relationships  . Social connections:    Talks on phone: Not on file    Gets together: Not on file    Attends  religious service: Not on file    Active member of club or organization: Not on file    Attends meetings of clubs or organizations: Not on file    Relationship status: Not on file  . Intimate partner violence:    Fear of current or ex partner: Not on file    Emotionally abused: Not on file    Physically abused: Not on file    Forced sexual activity: Not on file  Other Topics Concern  . Not on file  Social History Narrative  . Not on file    Allergies  Allergen Reactions  . Gluten Meal   . Oseltamivir Other (See Comments)    Severe jaw Pain  . Other Other (See Comments)    Beef Causes stomach pain  . Tamiflu [Oseltamivir Phosphate]   . Zofran [Ondansetron]     Current Outpatient Medications  Medication Sig Dispense Refill  . ARMOUR THYROID 120 MG tablet Take 120 mg by mouth daily.  0  . ARMOUR THYROID 15 MG tablet Take 15 mg by mouth daily.  0  . buPROPion (WELLBUTRIN XL) 300 MG 24 hr tablet Take 1 tablet (300 mg total) by mouth daily. 90 tablet 3  . linaclotide (LINZESS) 145 MCG CAPS capsule Take 2 capsules (290 mcg total) by mouth daily before breakfast. 60 capsule 3  . Multiple Vitamin (MULTIVITAMIN) tablet Take 1 tablet by mouth daily.    . SUMAtriptan (IMITREX) 100 MG tablet Take 1 tablet (100 mg total) by mouth daily as needed. 10 tablet 0   No current facility-administered medications for this visit.     ROS:   General:  No weight loss, Fever, chills  Cardiac: No recent episodes of chest pain/pressure, no shortness of breath at rest.  No shortness of breath with exertion.  Denies history of atrial fibrillation or irregular heartbeat  Vascular: + history of DVT    Physical Examination  Vitals:   12/19/18 1432  Temp: 97.6 F (36.4 C)  TempSrc: Oral    There is no height or weight on file to calculate BMI.  General:  Alert and oriented, no acute distress HEENT: Normal Skin: No rash, was unable to view the area of her primary varicosities due to  technical limitations of where the computer was positioned.  DATA:  Patient had a venous reflux ultrasound.  Patient had reflux in the left greater saphenous vein but vein diameter was only about 3 mm.  She also had reflux in the lesser saphenous vein bilaterally with 4 mm diameter.  She had no reflux in the right greater saphenous vein.  She did have some common  femoral vein reflux bilaterally.  ASSESSMENT: Patient with symptomatic varicose veins.  Office visit was limited technically today and that we could not see her legs below the knee level.  She does have reflux in the lesser saphenous vein with what sounds like a dilated lesser saphenous.  She does not really have a very enlarged greater saphenous vein bilaterally.   PLAN: #1 patient will continue to wear her lower extremity compression stockings for symptomatic relief.  She will also use nonsteroidal anti-inflammatories for pain and leg elevation.  She will see me in follow-up in the next couple of weeks so that we can get a better view of her lower extremities to see the extent of her varicosities and also place a SonoSite on the legs to see if she may be amenable to laser ablation of the lesser saphenous vein bilaterally.   Ruta Hinds, MD [facility] [place] Office: [phone] Pager: [phone]

## 2018-12-26 ENCOUNTER — Ambulatory Visit (INDEPENDENT_AMBULATORY_CARE_PROVIDER_SITE_OTHER): Payer: BC Managed Care – PPO | Admitting: Vascular Surgery

## 2018-12-26 ENCOUNTER — Encounter: Payer: Self-pay | Admitting: Vascular Surgery

## 2018-12-26 ENCOUNTER — Other Ambulatory Visit: Payer: Self-pay

## 2018-12-26 VITALS — BP 113/70 | HR 80 | Temp 98.4°F | Resp 14 | Ht 69.0 in | Wt 170.0 lb

## 2018-12-26 DIAGNOSIS — I83813 Varicose veins of bilateral lower extremities with pain: Secondary | ICD-10-CM | POA: Diagnosis not present

## 2018-12-26 NOTE — Progress Notes (Signed)
Patient name: Carrie [name] MRN: [id number] DOB: 1971/06/09 Sex: female   HPI: Carrie [name] is a 48 y.o. female, complains of bilateral lower extremity varicosities primarily in the calf area.  She also complains of fullness heaviness and aching that occurs in both lower extremities as the day progresses.  She works in [employer] school system.  He was previously seen by my partner Dr. Donzetta Matters January 2020 and sent for possible laser ablation.  She apparently had some type of venous procedure done in her right leg in the past.  Sounds like a laser ablation.   The states that her vein problems have worsened since her prior pregnancy.  Past Medical History:  Diagnosis Date  . Arthritis   . Asthma   . Colon polyps   . Frequent headaches   . Frequent refractory urinary tract infections   . Hay fever   . History of chicken pox   . Thyroid disease    Past Surgical History:  Procedure Laterality Date  . ADENOIDECTOMY  1975  . BREAST BIOPSY  1995  . CERVICAL DISCECTOMY  2003  . CHOLECYSTECTOMY  2013  . THYROIDECTOMY  2018    Family History  Problem Relation Age of Onset  . Depression Mother   . Pancreatitis Mother   . Alcohol abuse Father   . Depression Father   . Diabetes Father   . Early death Father   . Heart attack Father   . Heart disease Father   . Pancreatitis Father   . Depression Sister   . Hyperlipidemia Sister   . Mental illness Sister   . Mental illness Daughter   . Migraines Daughter   . Depression Maternal Grandmother   . Early death Maternal Grandmother   . Hyperlipidemia Maternal Grandmother   . Hypertension Maternal Grandmother   . Stroke Maternal Grandmother   . Learning disabilities Maternal Grandmother   . Depression Maternal Grandfather   . Early death Maternal Grandfather   . Alcohol abuse Maternal Grandfather   . Learning disabilities Maternal Grandfather   . Epilepsy Maternal Grandfather   . Suicidality Maternal Grandfather   .  Arthritis Paternal Grandmother   . Cancer Paternal Grandmother   . Hearing loss Paternal Grandmother   . Alcohol abuse Paternal Grandfather   . Hearing loss Paternal Grandfather   . Heart disease Paternal Grandfather   . Depression Sister   . Depression Sister     SOCIAL HISTORY: Social History   Socioeconomic History  . Marital status: Married    Spouse name: Not on file  . Number of children: Not on file  . Years of education: Not on file  . Highest education level: Not on file  Occupational History  . Not on file  Social Needs  . Financial resource strain: Not on file  . Food insecurity    Worry: Not on file    Inability: Not on file  . Transportation needs    Medical: Not on file    Non-medical: Not on file  Tobacco Use  . Smoking status: Never Smoker  . Smokeless tobacco: Never Used  Substance and Sexual Activity  . Alcohol use: Yes  . Drug use: Never  . Sexual activity: Yes    Partners: Male  Lifestyle  . Physical activity    Days per week: Not on file    Minutes per session: Not on file  . Stress: Not on file  Relationships  . Social Herbalist on phone:  Not on file    Gets together: Not on file    Attends religious service: Not on file    Active member of club or organization: Not on file    Attends meetings of clubs or organizations: Not on file    Relationship status: Not on file  . Intimate partner violence    Fear of current or ex partner: Not on file    Emotionally abused: Not on file    Physically abused: Not on file    Forced sexual activity: Not on file  Other Topics Concern  . Not on file  Social History Narrative  . Not on file    Allergies  Allergen Reactions  . Gluten Meal   . Oseltamivir Other (See Comments)    Severe jaw Pain  . Other Other (See Comments)    Beef Causes stomach pain  . Tamiflu [Oseltamivir Phosphate]   . Zofran [Ondansetron]     Current Outpatient Medications  Medication Sig Dispense Refill  .  ARMOUR THYROID 120 MG tablet Take 120 mg by mouth daily.  0  . buPROPion (WELLBUTRIN XL) 300 MG 24 hr tablet Take 1 tablet (300 mg total) by mouth daily. 90 tablet 3  . linaclotide (LINZESS) 145 MCG CAPS capsule Take 2 capsules (290 mcg total) by mouth daily before breakfast. 60 capsule 3  . Multiple Vitamin (MULTIVITAMIN) tablet Take 1 tablet by mouth daily.    . SUMAtriptan (IMITREX) 100 MG tablet Take 1 tablet (100 mg total) by mouth daily as needed. 10 tablet 0  . ARMOUR THYROID 15 MG tablet Take 15 mg by mouth daily.  0   No current facility-administered medications for this visit.     ROS:   Cardiac: No recent episodes of chest pain/pressure, no shortness of breath at rest.  No shortness of breath with exertion.  Denies history of atrial fibrillation or irregular heartbeat  Vascular: No history of rest pain in feet.  No history of claudication.  No history of non-healing ulcer, No history of DVT   Pulmonary: No home oxygen, no productive cough, no hemoptysis,  No asthma or wheezing    Physical Examination  Vitals:   12/26/18 1402  BP: 113/70  Pulse: 80  Resp: 14  Temp: 98.4 F (36.9 C)  TempSrc: Temporal  SpO2: 100%  Weight: 170 lb (77.1 kg)  Height: 5' 9"  (1.753 m)    Body mass index is 25.1 kg/m.  General:  Alert and oriented, no acute distress Skin: No rash, spider and reticular type varicosities posterior aspect of both legs primarily in the posterior thigh bilaterally Extremity Pulses:  2+ radial, brachial, femoral, dorsalis pedis, posterior tibial pulses bilaterally Musculoskeletal: No deformity or edema  Neurologic: Upper and lower extremity motor 5/5 and symmetric        DATA:  I reviewed the patient's venous duplex exam which showed a 3 to 4 mm lesser saphenous vein bilaterally with reflux.  There was minimal reflux in the greater saphenous vein.  I reexamined her lesser saphenous vein with the SonoSite at the bedside today.  The vein is about 3 mm in  diameter throughout most of its course.  ASSESSMENT: Symptomatic varicose veins with pain and swelling.  However her greater saphenous and lesser saphenous veins are actually fairly small diameter despite having some reflux I do not believe she would benefit significantly from laser ablation.  I did discuss the patient today the possibility of some sclerotherapy of her posterior thighs bilaterally and she  is interested in this.   PLAN: Patient will continue to wear compression stockings for symptomatic relief.  We will put her on the schedule for a sclerotherapy of both legs.  I discussed this with our vein nurse today who will get her scheduled when availability is possible.   Ruta Hinds, MD [facility] Office: [phone] Pager: [phone]

## 2018-12-29 ENCOUNTER — Encounter: Payer: Self-pay | Admitting: Family Medicine

## 2019-01-04 ENCOUNTER — Encounter: Payer: Self-pay | Admitting: Family Medicine

## 2019-01-04 ENCOUNTER — Other Ambulatory Visit: Payer: Self-pay

## 2019-01-04 ENCOUNTER — Ambulatory Visit: Payer: Self-pay | Admitting: *Deleted

## 2019-01-04 ENCOUNTER — Ambulatory Visit (INDEPENDENT_AMBULATORY_CARE_PROVIDER_SITE_OTHER): Payer: BC Managed Care – PPO | Admitting: Family Medicine

## 2019-01-04 VITALS — Temp 97.9°F

## 2019-01-04 DIAGNOSIS — G43019 Migraine without aura, intractable, without status migrainosus: Secondary | ICD-10-CM | POA: Diagnosis not present

## 2019-01-04 NOTE — Progress Notes (Signed)
Virtual Visit via Video Note  Subjective  CC:  Chief Complaint  Patient presents with  . Headache    Started last night.. Has tried IMITREX and another headache RX (cant find the name) with minimal relief  . Emesis  . Chills     I connected with Ruby Cola on 01/04/19 at  2:20 PM EDT by a video enabled telemedicine application and verified that I am speaking with the correct person using two identifiers. Location patient: Home Location provider: [facility] [place], Office Persons participating in the virtual visit: [name], Leamon Arnt, MD Lilli Light, [place] discussed the limitations of evaluation and management by telemedicine and the availability of in person appointments. The patient expressed understanding and agreed to proceed. HPI: Carrie [name] is a 48 y.o. female who presents to the office today to address the problems listed above in the chief complaint.  48 year old female with history of migraine headaches was scheduled for virtual visit for severe headache.  Patient reports that she has had a persistent headache since yesterday afternoon.  Associated symptoms include nausea, vomiting and chills without fever.  She has history of migraine headaches but reports this is the worst that she is ever had.  She has no known trigger.  She tried triptan's without any relief.  This is atypical for her.  She denies stiff neck.  She denies abdominal pain.  She has no respiratory symptoms, cough or recent exposures to COVID.  She is tearful.  She denies paresis, diplopia, dysarthria. Assessment  1. 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  Patient needs exam given her atypical and severe migraine headache symptoms.  Needs treatment due to the intractability.  Recommend urgent care versus ER visit for further evaluation and treatment.   Unable to do neurology exam via telehealth.  Not comfortable sending pain medicines due to its severe and atypical features.  Patient understands and agrees to seek help the urgent care or emergency room.  Follow up: No follow-ups on file.  Visit date not found  No orders of the defined types were placed in this encounter.  No orders of the defined types were placed in this encounter.     I reviewed the patients updated PMH, FH, and SocHx.    Patient Active Problem List   Diagnosis Date Noted  . Constipation 09/03/2018  . Varicose veins of both lower extremities with pain 04/20/2018  . Migraines 04/13/2018  . Hypothyroidism 04/13/2018  . Major depression in remission ([place]) 04/13/2018  . History of asthma 04/13/2018   Current Meds  Medication Sig  . ARMOUR THYROID 120 MG tablet Take 120 mg by mouth daily.  Marland Kitchen buPROPion (WELLBUTRIN XL) 300 MG 24 hr tablet Take 1 tablet (300 mg total) by mouth daily.  Marland Kitchen linaclotide (LINZESS) 145 MCG CAPS capsule Take 2 capsules (290 mcg total) by mouth daily before breakfast.  . Multiple Vitamin (MULTIVITAMIN) tablet Take 1 tablet by mouth daily.  . SUMAtriptan (IMITREX) 100 MG tablet Take 1 tablet (100 mg total) by mouth daily as needed.    Allergies: Patient is allergic to gluten meal; oseltamivir; other; tamiflu [oseltamivir phosphate]; and zofran [ondansetron]. Family History: Patient family history includes Alcohol abuse in her father, maternal grandfather, and paternal grandfather; Arthritis in her paternal grandmother; Cancer in her paternal grandmother; Depression in her father, maternal grandfather, maternal grandmother,  mother, sister, sister, and sister; Diabetes in her father; Early death in her father, maternal grandfather, and maternal grandmother; Epilepsy in her maternal grandfather; Hearing loss in her paternal grandfather and paternal grandmother; Heart attack in her father; Heart disease in her father and paternal grandfather;  Hyperlipidemia in her maternal grandmother and sister; Hypertension in her maternal grandmother; Learning disabilities in her maternal grandfather and maternal grandmother; Mental illness in her daughter and sister; Migraines in her daughter; Pancreatitis in her father and mother; Stroke in her maternal grandmother; Suicidality in her maternal grandfather. Social History:  Patient  reports that she has never smoked. She has never used smokeless tobacco. She reports current alcohol use. She reports that she does not use drugs.  Review of Systems: Constitutional: Negative for fever malaise or anorexia Cardiovascular: negative for chest pain Respiratory: negative for SOB or persistent cough Gastrointestinal: negative for abdominal pain  Objective  Vitals: Temp 97.9 F (36.6 C) (Oral)  General: Tearful, appears tired. Psych: Alert and oriented x3, normal speech, fair insight

## 2019-01-04 NOTE — Telephone Encounter (Signed)
Pt called stating that she thinks she needs to be tested for covid-19. She has had the worst headache of her life. She has hx of migraines and has taken her meds for it but has not touched it. She has also vomited. Denies diarrhea, fever or shortness of breath  She also has had chills but no elevation in her temperature. Also having fatigue. Advised of having a virtual appointment, pt voiced understanding. Notified [facility] at [facility] for an appointment and call transferred over. Routing to the practice for review..   Reason for Disposition . [1] COVID-19 infection suspected by caller or triager AND [2] mild symptoms (cough, fever, or others) AND [2] no complications or SOB  Answer Assessment - Initial Assessment Questions 1. COVID-19 DIAGNOSIS: "Who made your Coronavirus (COVID-19) diagnosis?" "Was it confirmed by a positive lab test?" If not diagnosed by a HCP, ask "Are there lots of cases (community spread) where you live?" (See public health department website, if unsure)     In the communithy 2. ONSET: "When did the COVID-19 symptoms start?"      Last night 3. WORST SYMPTOM: "What is your worst symptom?" (e.g., cough, fever, shortness of breath, muscle aches)     headache 4. COUGH: "Do you have a cough?" If so, ask: "How bad is the cough?"       no 5. FEVER: "Do you have a fever?" If so, ask: "What is your temperature, how was it measured, and when did it start?"     no 6. RESPIRATORY STATUS: "Describe your breathing?" (e.g., shortness of breath, wheezing, unable to speak)      no 7. BETTER-SAME-WORSE: "Are you getting better, staying the same or getting worse compared to yesterday?"  If getting worse, ask, "In what way?"     Worst today 8. HIGH RISK DISEASE: "Do you have any chronic medical problems?" (e.g., asthma, heart or lung disease, weak immune system, etc.)     Thyroid removed  9. PREGNANCY: "Is there any chance you are pregnant?" "When was your last menstrual period?"   Not having periods 10. OTHER SYMPTOMS: "Do you have any other symptoms?"  (e.g., chills, fatigue, headache, loss of smell or taste, muscle pain, sore throat)       Chills, headache, fatigue  Protocols used: CORONAVIRUS (COVID-19) DIAGNOSED OR SUSPECTED-A-AH

## 2019-01-08 ENCOUNTER — Encounter: Payer: Self-pay | Admitting: Family Medicine

## 2019-01-08 ENCOUNTER — Other Ambulatory Visit: Payer: Self-pay

## 2019-01-09 ENCOUNTER — Other Ambulatory Visit: Payer: Self-pay

## 2019-01-09 MED ORDER — ZONISAMIDE 100 MG PO CAPS: 100.0000 mg | ORAL_CAPSULE | Freq: Every day | ORAL | 3 refills | Status: DC

## 2019-01-18 ENCOUNTER — Telehealth: Payer: BC Managed Care – PPO | Admitting: Family

## 2019-01-18 DIAGNOSIS — Z20822 Contact with and (suspected) exposure to covid-19: Secondary | ICD-10-CM

## 2019-01-18 MED ORDER — BENZONATATE 100 MG PO CAPS: 100.0000 mg | ORAL_CAPSULE | Freq: Three times a day (TID) | ORAL | 0 refills | Status: DC | PRN

## 2019-01-18 MED ORDER — ALBUTEROL SULFATE HFA 108 (90 BASE) MCG/ACT IN AERS: 2.0000 | INHALATION_SPRAY | RESPIRATORY_TRACT | 2 refills | Status: AC | PRN

## 2019-01-18 NOTE — Progress Notes (Signed)
Greater than 5 minutes, yet less than 10 minutes of time have been spent researching, coordinating, and implementing care for this patient today.  Thank you for the details you included in the comment boxes. Those details are very helpful in determining the best course of treatment for you and help [place] to provide the best care.  E-Visit for Corona Virus Screening   Your current symptoms could be consistent with the coronavirus.  Call your health care provider or local health department to request and arrange formal testing. Many health care providers can now test patients at their office but not all are.  Please quarantine yourself while awaiting your test results.  [place] [phone], [place], [place] [phone] or visit [email or web address]  and You have been enrolled in [place] for COVID-19.  Daily you will receive a questionnaire within the [facility] website. Our COVID-19 response team will be monitoring your responses daily.    COVID-19 is a respiratory illness with symptoms that are similar to the flu. Symptoms are typically mild to moderate, but there have been cases of severe illness and death due to the virus. The following symptoms may appear 2-14 days after exposure: . Fever . Cough . Shortness of breath or difficulty breathing . Chills . Repeated shaking with chills . Muscle pain . Headache . Sore throat . New loss of taste or smell . Fatigue . Congestion or runny nose . Nausea or vomiting . Diarrhea  It is vitally important that if you feel that you have an infection such as this virus or any other virus that you stay home and away from places where you may spread it to others.  You should self-quarantine for 14 days if you have symptoms that could potentially be coronavirus or have been in close contact a with a  person diagnosed with COVID-19 within the last 2 weeks. You should avoid contact with people age 103 and older.   You should wear a mask or cloth face covering over your nose and mouth if you must be around other people or animals, including pets (even at home). Try to stay at least 6 feet away from other people. This will protect the people around you.  You can use medication such as A prescription cough medication called Tessalon Perles 100 mg. You may take 1-2 capsules every 8 hours as needed for cough and A prescription inhaler called Albuterol MDI 90 mcg /actuation 2 puffs every 4 hours as needed for shortness of breath, wheezing, cough  You may also take acetaminophen (Tylenol) as needed for fever.   Reduce your risk of any infection by using the same precautions used for avoiding the common cold or flu:  Marland Kitchen Wash your hands often with soap and warm water for at least 20 seconds.  If soap and water are not readily available, use an alcohol-based hand sanitizer with at least 60% alcohol.  . If coughing or sneezing, cover your mouth and nose by coughing or sneezing into the elbow areas of your shirt or coat, into a tissue or into your sleeve (not your hands). . Avoid shaking hands with others and consider head nods or verbal greetings only. . Avoid touching your eyes, nose, or mouth with unwashed hands.  . Avoid close contact with people who are sick. . Avoid places or events with large numbers of people in one location, like concerts or sporting events. . Carefully consider travel plans you have or  are making. . If you are planning any travel outside or inside the [place], visit the CDC's Travelers' Health webpage for the latest health notices. . If you have some symptoms but not all symptoms, continue to monitor at home and seek medical attention if your symptoms worsen. . If you are having a medical emergency, call 911.  HOME CARE . Only take medications as instructed by your medical  team. . Drink plenty of fluids and get plenty of rest. . A steam or ultrasonic humidifier can help if you have congestion.   GET HELP RIGHT AWAY IF YOU HAVE EMERGENCY WARNING SIGNS** FOR COVID-19. If you or someone is showing any of these signs seek emergency medical care immediately. Call 911 or proceed to your closest emergency facility if: . You develop worsening high fever. . Trouble breathing . Bluish lips or face . Persistent pain or pressure in the chest . New confusion . Inability to wake or stay awake . You cough up blood. . Your symptoms become more severe  **This list is not all possible symptoms. Contact your medical provider for any symptoms that are sever or concerning to you.   MAKE SURE YOU   Understand these instructions.  Will watch your condition.  Will get help right away if you are not doing well or get worse.  Your e-visit answers were reviewed by a board certified advanced clinical practitioner to complete your personal care plan.  Depending on the condition, your plan could have included both over the counter or prescription medications.  If there is a problem please reply once you have received a response from your provider.  Your safety is important to [place].  If you have drug allergies check your prescription carefully.    You can use MyChart to ask questions about today's visit, request a non-urgent call back, or ask for a work or school excuse for 24 hours related to this e-Visit. If it has been greater than 24 hours you will need to follow up with your provider, or enter a new e-Visit to address those concerns. You will get an e-mail in the next two days asking about your experience.  I hope that your e-visit has been valuable and will speed your recovery. Thank you for using e-visits.

## 2019-01-19 ENCOUNTER — Encounter (INDEPENDENT_AMBULATORY_CARE_PROVIDER_SITE_OTHER): Payer: Self-pay

## 2019-01-20 ENCOUNTER — Encounter (INDEPENDENT_AMBULATORY_CARE_PROVIDER_SITE_OTHER): Payer: Self-pay

## 2019-01-20 ENCOUNTER — Telehealth: Payer: Self-pay

## 2019-01-20 NOTE — Telephone Encounter (Signed)
Pt stated that cough has increased more from yesterday. Also c/o more fatigue. Is mobile but feels more tired today and "takes more effort than it normally does to do things."  Discussed treatment for cough and for weakness. Pt verbalized understanding.

## 2019-01-23 ENCOUNTER — Encounter (INDEPENDENT_AMBULATORY_CARE_PROVIDER_SITE_OTHER): Payer: Self-pay

## 2019-03-12 ENCOUNTER — Other Ambulatory Visit: Payer: Self-pay

## 2019-03-12 MED ORDER — LINACLOTIDE 145 MCG PO CAPS: 290.0000 ug | ORAL_CAPSULE | Freq: Every day | ORAL | 3 refills | Status: DC

## 2019-03-13 ENCOUNTER — Encounter: Payer: Self-pay | Admitting: Family Medicine

## 2019-03-13 ENCOUNTER — Other Ambulatory Visit: Payer: Self-pay

## 2019-03-13 MED ORDER — LINACLOTIDE 145 MCG PO CAPS: 290.0000 ug | ORAL_CAPSULE | Freq: Every day | ORAL | 3 refills | Status: DC

## 2019-03-21 ENCOUNTER — Encounter: Payer: Self-pay | Admitting: Family Medicine

## 2019-04-18 ENCOUNTER — Other Ambulatory Visit: Payer: Self-pay

## 2019-04-22 ENCOUNTER — Other Ambulatory Visit: Payer: Self-pay

## 2019-04-22 ENCOUNTER — Encounter: Payer: Self-pay | Admitting: Obstetrics and Gynecology

## 2019-04-22 ENCOUNTER — Ambulatory Visit: Payer: BC Managed Care – PPO | Admitting: Obstetrics and Gynecology

## 2019-04-22 ENCOUNTER — Other Ambulatory Visit (HOSPITAL_COMMUNITY)
Admission: RE | Admit: 2019-04-22 | Discharge: 2019-04-22 | Disposition: A | Discharge status: Home / Self Care | Payer: BC Managed Care – PPO | Source: Ambulatory Visit | Attending: Obstetrics and Gynecology | Admitting: Obstetrics and Gynecology

## 2019-04-22 VITALS — BP 124/80 | HR 72 | Temp 97.2°F | Ht 69.0 in | Wt 165.0 lb

## 2019-04-22 DIAGNOSIS — Z8742 Personal history of other diseases of the female genital tract: Secondary | ICD-10-CM | POA: Insufficient documentation

## 2019-04-22 DIAGNOSIS — Z124 Encounter for screening for malignant neoplasm of cervix: Secondary | ICD-10-CM

## 2019-04-22 DIAGNOSIS — N760 Acute vaginitis: Secondary | ICD-10-CM

## 2019-04-22 DIAGNOSIS — Z01419 Encounter for gynecological examination (general) (routine) without abnormal findings: Secondary | ICD-10-CM | POA: Diagnosis not present

## 2019-04-22 DIAGNOSIS — B9689 Other specified bacterial agents as the cause of diseases classified elsewhere: Secondary | ICD-10-CM | POA: Diagnosis not present

## 2019-04-22 DIAGNOSIS — N951 Menopausal and female climacteric states: Secondary | ICD-10-CM | POA: Diagnosis not present

## 2019-04-22 MED ORDER — METRONIDAZOLE 0.75 % VA GEL: 1.0000 | Freq: Every day | VAGINAL | 0 refills | Status: DC

## 2019-04-22 MED ORDER — GABAPENTIN 100 MG PO CAPS: ORAL_CAPSULE | ORAL | 1 refills | Status: DC

## 2019-04-22 NOTE — Patient Instructions (Signed)
EXERCISE AND DIET:  We recommended that you start or continue a regular exercise program for good health. Regular exercise means any activity that makes your heart beat faster and makes you sweat.  We recommend exercising at least 30 minutes per day at least 3 days a week, preferably 4 or 5.  We also recommend a diet low in fat and sugar.  Inactivity, poor dietary choices and obesity can cause diabetes, heart attack, stroke, and kidney damage, among others.   ° °ALCOHOL AND SMOKING:  Women should limit their alcohol intake to no more than 7 drinks/beers/glasses of wine (combined, not each!) per week. Moderation of alcohol intake to this level decreases your risk of breast cancer and liver damage. And of course, no recreational drugs are part of a healthy lifestyle.  And absolutely no smoking or even second hand smoke. Most people know smoking can cause heart and lung diseases, but did you know it also contributes to weakening of your bones? Aging of your skin?  Yellowing of your teeth and nails? ° °CALCIUM AND VITAMIN D:  Adequate intake of calcium and Vitamin D are recommended.  The recommendations for exact amounts of these supplements seem to change often, but generally speaking 1,200 mg of calcium (between diet and supplement) and 800 units of Vitamin D per day seems prudent. Certain women may benefit from higher intake of Vitamin D.  If you are among these women, your doctor will have told you during your visit.   ° °PAP SMEARS:  Pap smears, to check for cervical cancer or precancers,  have traditionally been done yearly, although recent scientific advances have shown that most women can have pap smears less often.  However, every woman still should have a physical exam from her gynecologist every year. It will include a breast check, inspection of the vulva and vagina to check for abnormal growths or skin changes, a visual exam of the cervix, and then an exam to evaluate the size and shape of the uterus and  ovaries.  And after 47 years of age, a rectal exam is indicated to check for rectal cancers. We will also provide age appropriate advice regarding health maintenance, like when you should have certain vaccines, screening for sexually transmitted diseases, bone density testing, colonoscopy, mammograms, etc.  ° °MAMMOGRAMS:  All women over 40 years old should have a yearly mammogram. Many facilities now offer a "3D" mammogram, which may cost around $50 extra out of pocket. If possible,  we recommend you accept the option to have the 3D mammogram performed.  It both reduces the number of women who will be called back for extra views which then turn out to be normal, and it is better than the routine mammogram at detecting truly abnormal areas.   ° °COLON CANCER SCREENING: Now recommend starting at age 45. At this time colonoscopy is not covered for routine screening until 50. There are take home tests that can be done between 45-49.  ° °COLONOSCOPY:  Colonoscopy to screen for colon cancer is recommended for all women at age 50.  We know, you hate the idea of the prep.  We agree, BUT, having colon cancer and not knowing it is worse!!  Colon cancer so often starts as a polyp that can be seen and removed at colonscopy, which can quite literally save your life!  And if your first colonoscopy is normal and you have no family history of colon cancer, most women don't have to have it again for   10 years.  Once every ten years, you can do something that may end up saving your life, right?  We will be happy to help you get it scheduled when you are ready.  Be sure to check your insurance coverage so you understand how much it will cost.  It may be covered as a preventative service at no cost, but you should check your particular policy.   ° ° ° °Breast Self-Awareness °Breast self-awareness means being familiar with how your breasts look and feel. It involves checking your breasts regularly and reporting any changes to your  health care provider. °Practicing breast self-awareness is important. A change in your breasts can be a sign of a serious medical problem. Being familiar with how your breasts look and feel allows you to find any problems early, when treatment is more likely to be successful. All women should practice breast self-awareness, including women who have had breast implants. °How to do a breast self-exam °One way to learn what is normal for your breasts and whether your breasts are changing is to do a breast self-exam. To do a breast self-exam: °Look for Changes ° °1. Remove all the clothing above your waist. °2. Stand in front of a mirror in a room with good lighting. °3. Put your hands on your hips. °4. Push your hands firmly downward. °5. Compare your breasts in the mirror. Look for differences between them (asymmetry), such as: °? Differences in shape. °? Differences in size. °? Puckers, dips, and bumps in one breast and not the other. °6. Look at each breast for changes in your skin, such as: °? Redness. °? Scaly areas. °7. Look for changes in your nipples, such as: °? Discharge. °? Bleeding. °? Dimpling. °? Redness. °? A change in position. °Feel for Changes °Carefully feel your breasts for lumps and changes. It is best to do this while lying on your back on the floor and again while sitting or standing in the shower or tub with soapy water on your skin. Feel each breast in the following way: °· Place the arm on the side of the breast you are examining above your head. °· Feel your breast with the other hand. °· Start in the nipple area and make ¾ inch (2 cm) overlapping circles to feel your breast. Use the pads of your three middle fingers to do this. Apply light pressure, then medium pressure, then firm pressure. The light pressure will allow you to feel the tissue closest to the skin. The medium pressure will allow you to feel the tissue that is a little deeper. The firm pressure will allow you to feel the tissue  close to the ribs. °· Continue the overlapping circles, moving downward over the breast until you feel your ribs below your breast. °· Move one finger-width toward the center of the body. Continue to use the ¾ inch (2 cm) overlapping circles to feel your breast as you move slowly up toward your collarbone. °· Continue the up and down exam using all three pressures until you reach your armpit. ° °Write Down What You Find ° °Write down what is normal for each breast and any changes that you find. Keep a written record with breast changes or normal findings for each breast. By writing this information down, you do not need to depend only on memory for size, tenderness, or location. Write down where you are in your menstrual cycle, if you are still menstruating. °If you are having trouble noticing differences   in your breasts, do not get discouraged. With time you will become more familiar with the variations in your breasts and more comfortable with the exam. How often should I examine my breasts? Examine your breasts every month. If you are breastfeeding, the best time to examine your breasts is after a feeding or after using a breast pump. If you menstruate, the best time to examine your breasts is 5-7 days after your period is over. During your period, your breasts are lumpier, and it may be more difficult to notice changes. When should I see my health care provider? See your health care provider if you notice:  A change in shape or size of your breasts or nipples.  A change in the skin of your breast or nipples, such as a reddened or scaly area.  Unusual discharge from your nipples.  A lump or thick area that was not there before.  Pain in your breasts.  Anything that concerns you.  Menopause Menopause is the normal time of life when menstrual periods stop completely. It is usually confirmed by 12 months without a menstrual period. The transition to menopause (perimenopause) most often happens  between the ages of 68 and 24. During perimenopause, hormone levels change in your body, which can cause symptoms and affect your health. Menopause may increase your risk for:  Loss of bone (osteoporosis), which causes bone breaks (fractures).  Depression.  Hardening and narrowing of the arteries (atherosclerosis), which can cause heart attacks and strokes. What are the causes? This condition is usually caused by a natural change in hormone levels that happens as you get older. The condition may also be caused by surgery to remove both ovaries (bilateral oophorectomy). What increases the risk? This condition is more likely to start at an earlier age if you have certain medical conditions or treatments, including:  A tumor of the pituitary gland in the brain.  A disease that affects the ovaries and hormone production.  Radiation treatment for cancer.  Certain cancer treatments, such as chemotherapy or hormone (anti-estrogen) therapy.  Heavy smoking and excessive alcohol use.  Family history of early menopause. This condition is also more likely to develop earlier in women who are very thin. What are the signs or symptoms? Symptoms of this condition include:  Hot flashes.  Irregular menstrual periods.  Night sweats.  Changes in feelings about sex. This could be a decrease in sex drive or an increased comfort around your sexuality.  Vaginal dryness and thinning of the vaginal walls. This may cause painful intercourse.  Dryness of the skin and development of wrinkles.  Headaches.  Problems sleeping (insomnia).  Mood swings or irritability.  Memory problems.  Weight gain.  Hair growth on the face and chest.  Bladder infections or problems with urinating. How is this diagnosed? This condition is diagnosed based on your medical history, a physical exam, your age, your menstrual history, and your symptoms. Hormone tests may also be done. How is this treated? In some  cases, no treatment is needed. You and your health care provider should make a decision together about whether treatment is necessary. Treatment will be based on your individual condition and preferences. Treatment for this condition focuses on managing symptoms. Treatment may include:  Menopausal hormone therapy (MHT).  Medicines to treat specific symptoms or complications.  Acupuncture.  Vitamin or herbal supplements. Before starting treatment, make sure to let your health care provider know if you have a personal or family history of:  Heart disease.  Breast cancer.  Blood clots.  Diabetes.  Osteoporosis. Follow these instructions at home: Lifestyle  Do not use any products that contain nicotine or tobacco, such as cigarettes and e-cigarettes. If you need help quitting, ask your health care provider.  Get at least 30 minutes of physical activity on 5 or more days each week.  Avoid alcoholic and caffeinated beverages, as well as spicy foods. This may help prevent hot flashes.  Get 7-8 hours of sleep each night.  If you have hot flashes, try: ? Dressing in layers. ? Avoiding things that may trigger hot flashes, such as spicy food, warm places, or stress. ? Taking slow, deep breaths when a hot flash starts. ? Keeping a fan in your home and office.  Find ways to manage stress, such as deep breathing, meditation, or journaling.  Consider going to group therapy with other women who are having menopause symptoms. Ask your health care provider about recommended group therapy meetings. Eating and drinking  Eat a healthy, balanced diet that contains whole grains, lean protein, low-fat dairy, and plenty of fruits and vegetables.  Your health care provider may recommend adding more soy to your diet. Foods that contain soy include tofu, tempeh, and soy milk.  Eat plenty of foods that contain calcium and vitamin D for bone health. Items that are rich in calcium include low-fat  milk, yogurt, beans, almonds, sardines, broccoli, and kale. Medicines  Take over-the-counter and prescription medicines only as told by your health care provider.  Talk with your health care provider before starting any herbal supplements. If prescribed, take vitamins and supplements as told by your health care provider. These may include: ? Calcium. Women age 9 and older should get 1,200 mg (milligrams) of calcium every day. ? Vitamin D. Women need 600-800 International Units of vitamin D each day. ? Vitamins B12 and B6. Aim for 50 micrograms of B12 and 1.5 mg of B6 each day. General instructions  Keep track of your menstrual periods, including: ? When they occur. ? How heavy they are and how long they last. ? How much time passes between periods.  Keep track of your symptoms, noting when they start, how often you have them, and how long they last.  Use vaginal lubricants or moisturizers to help with vaginal dryness and improve comfort during sex.  Keep all follow-up visits as told by your health care provider. This is important. This includes any group therapy or counseling. Contact a health care provider if:  You are still having menstrual periods after age 77.  You have pain during sex.  You have not had a period for 12 months and you develop vaginal bleeding. Get help right away if:  You have: ? Severe depression. ? Excessive vaginal bleeding. ? Pain when you urinate. ? A fast or irregular heart beat (palpitations). ? Severe headaches. ? Abdomen (abdominal) pain or severe indigestion.  You fell and you think you have a broken bone.  You develop leg or chest pain.  You develop vision problems.  You feel a lump in your breast. Summary  Menopause is the normal time of life when menstrual periods stop completely. It is usually confirmed by 12 months without a menstrual period.  The transition to menopause (perimenopause) most often happens between the ages of 60 and  20.  Symptoms can be managed through medicines, lifestyle changes, and complementary therapies such as acupuncture.  Eat a balanced diet that is rich in nutrients to promote bone health and heart  health and to manage symptoms during menopause. This information is not intended to replace advice given to you by your health care provider. Make sure you discuss any questions you have with your health care provider. Document Released: 09/17/2003 Document Revised: 06/09/2017 Document Reviewed: 07/30/2016 Elsevier Patient Education  2020 [employer].  Vaginitis Vaginitis is a condition in which the vaginal tissue swells and becomes red (inflamed). This condition is most often caused by a change in the normal balance of bacteria and yeast that live in the vagina. This change causes an overgrowth of certain bacteria or yeast, which causes the inflammation. There are different types of vaginitis, but the most common types are:  Bacterial vaginosis.  Yeast infection (candidiasis).  Trichomoniasis vaginitis. This is a sexually transmitted disease (STD).  Viral vaginitis.  Atrophic vaginitis.  Allergic vaginitis. What are the causes? The cause of this condition depends on the type of vaginitis. It can be caused by:  Bacteria (bacterial vaginosis).  Yeast, which is a fungus (yeast infection).  A parasite (trichomoniasis vaginitis).  A virus (viral vaginitis).  Low hormone levels (atrophic vaginitis). Low hormone levels can occur during pregnancy, breastfeeding, or after menopause.  Irritants, such as bubble baths, scented tampons, and feminine sprays (allergic vaginitis). Other factors can change the normal balance of the yeast and bacteria that live in the vagina. These include:  Antibiotic medicines.  Poor hygiene.  Diaphragms, vaginal sponges, spermicides, birth control pills, and intrauterine devices (IUD).  Sex.  Infection.  Uncontrolled diabetes.  A weakened defense  (immune) system. What increases the risk? This condition is more likely to develop in women who:  Smoke.  Use vaginal douches, scented tampons, or scented sanitary pads.  Wear tight-fitting pants.  Wear thong underwear.  Use oral birth control pills or an IUD.  Have sex without a condom.  Have multiple sex partners.  Have an STD.  Frequently use the spermicide [id number].  Eat lots of foods high in sugar.  Have uncontrolled diabetes.  Have low estrogen levels.  Have a weakened immune system from an immune disorder or medical treatment.  Are pregnant or breastfeeding. What are the signs or symptoms? Symptoms vary depending on the cause of the vaginitis. Common symptoms include:  Abnormal vaginal discharge. ? The discharge is white, gray, or yellow with bacterial vaginosis. ? The discharge is thick, white, and cheesy with a yeast infection. ? The discharge is frothy and yellow or greenish with trichomoniasis.  A bad vaginal smell. The smell is fishy with bacterial vaginosis.  Vaginal itching, pain, or swelling.  Sex that is painful.  Pain or burning when urinating. Sometimes there are no symptoms. How is this diagnosed? This condition is diagnosed based on your symptoms and medical history. A physical exam, including a pelvic exam, will also be done. You may also have other tests, including:  Tests to determine the pH level (acidity or alkalinity) of your vagina.  A whiff test, to assess the odor that results when a sample of your vaginal discharge is mixed with a potassium hydroxide solution.  Tests of vaginal fluid. A sample will be examined under a microscope. How is this treated? Treatment varies depending on the type of vaginitis you have. Your treatment may include:  Antibiotic creams or pills to treat bacterial vaginosis and trichomoniasis.  Antifungal medicines, such as vaginal creams or suppositories, to treat a yeast infection.  Medicine to ease  discomfort if you have viral vaginitis. Your sexual partner should also be treated.  Estrogen  delivered in a cream, pill, suppository, or vaginal ring to treat atrophic vaginitis. If vaginal dryness occurs, lubricants and moisturizing creams may help. You may need to avoid scented soaps, sprays, or douches.  Stopping use of a product that is causing allergic vaginitis. Then using a vaginal cream to treat the symptoms. Follow these instructions at home: Lifestyle  Keep your genital area clean and dry. Avoid soap, and only rinse the area with water.  Do not douche or use tampons until your health care provider says it is okay to do so. Use sanitary pads, if needed.  Do not have sex until your health care provider approves. When you can return to sex, practice safe sex and use condoms.  Wipe from front to back. This avoids the spread of bacteria from the rectum to the vagina. General instructions  Take over-the-counter and prescription medicines only as told by your health care provider.  If you were prescribed an antibiotic medicine, take or use it as told by your health care provider. Do not stop taking or using the antibiotic even if you start to feel better.  Keep all follow-up visits as told by your health care provider. This is important. How is this prevented?  Use mild, non-scented products. Do not use things that can irritate the vagina, such as fabric softeners. Avoid the following products if they are scented: ? Feminine sprays. ? Detergents. ? Tampons. ? Feminine hygiene products. ? Soaps or bubble baths.  Let air reach your genital area. ? Wear cotton underwear to reduce moisture buildup. ? Avoid wearing underwear while you sleep. ? Avoid wearing tight pants and underwear or nylons without a cotton panel. ? Avoid wearing thong underwear.  Take off any wet clothing, such as bathing suits, as soon as possible.  Practice safe sex and use condoms. Contact a health care  provider if:  You have abdominal pain.  You have a fever.  You have symptoms that last for more than 2-3 days. Get help right away if:  You have a fever and your symptoms suddenly get worse. Summary  Vaginitis is a condition in which the vaginal tissue becomes inflamed.This condition is most often caused by a change in the normal balance of bacteria and yeast that live in the vagina.  Treatment varies depending on the type of vaginitis you have.  Do not douche, use tampons , or have sex until your health care provider approves. When you can return to sex, practice safe sex and use condoms. This information is not intended to replace advice given to you by your health care provider. Make sure you discuss any questions you have with your health care provider. Document Released: 04/24/2007 Document Revised: 06/09/2017 Document Reviewed: 08/02/2016 Elsevier Patient Education  2020 [employer].

## 2019-04-22 NOTE — Progress Notes (Addendum)
48 y.o. [id number] Married White or Caucasian Not Hispanic or Latino female here for annual exam.  H/O endometrial ablation and tubal ligation. No bleeding. She has been having hot flashes and night sweats for years, started when her thyroid was removed. Mostly tolerable. She is having ~4-8 hot flashes a day, night is worse, up ~4 x a night. She can have trouble falling back to sleep.   Newly married, no dyspareunia.   H/O blood clot in her leg in 1999, superficial thrombophlebitis.     No LMP recorded. Patient has had an ablation.          Sexually active: Yes.    The current method of family planning is tubal ligation.    Exercising: Yes.    yoga, daily, walking/hiking Smoker:  no  Health Maintenance: Pap:  12/2017 abnormal per patient in [place] History of abnormal Pap:  Yes, has had colposcopies, had one in 2019, was told to follow up in 1 year. Has had 3 colposcopies MMG:  Summer 2019 in [place], [place] per patient  BMD:   Never Colonoscopy: 2015 polyps TDaP:  Unsure, she will check.  Gardasil: N/A   reports that she has never smoked. She has never used smokeless tobacco. She reports current alcohol use. She reports that she does not use drugs. 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 Kids are 53, 21 and 24.   Past Medical History:  Diagnosis Date  . Amenorrhea   . Arthritis   . Asthma   . Colon polyps   . Depression   . Frequent headaches   . Frequent refractory urinary tract infections   . Hay fever   . History of chicken pox   . Superficial thrombophlebitis    Post partum  . Thyroid disease     Past Surgical History:  Procedure Laterality Date  . ADENOIDECTOMY  1975  . BREAST BIOPSY  1995  . CERVICAL DISCECTOMY  2003  . CHOLECYSTECTOMY  2013  . COLPOSCOPY    . ENDOMETRIAL ABLATION    . THYROIDECTOMY  2018  . TUBAL LIGATION      Current Outpatient Medications  Medication Sig Dispense Refill  . albuterol  (VENTOLIN HFA) 108 (90 Base) MCG/ACT inhaler Inhale 2 puffs into the lungs every 4 (four) hours as needed for shortness of breath. 8 g 2  . ARMOUR THYROID 120 MG tablet Take 120 mg by mouth daily.  0  . buPROPion (WELLBUTRIN XL) 300 MG 24 hr tablet Take 1 tablet (300 mg total) by mouth daily. 90 tablet 3  . linaclotide (LINZESS) 145 MCG CAPS capsule Take 2 capsules (290 mcg total) by mouth daily before breakfast. 60 capsule 3  . liothyronine (CYTOMEL) 5 MCG tablet Take 5 mcg by mouth 2 (two) times daily.    . Multiple Vitamin (MULTIVITAMIN) tablet Take 1 tablet by mouth daily.    . SUMAtriptan (IMITREX) 100 MG tablet Take 1 tablet (100 mg total) by mouth daily as needed. 10 tablet 0  . zonisamide (ZONEGRAN) 100 MG capsule Take 1 capsule (100 mg total) by mouth daily. 30 capsule 3   No current facility-administered medications for this visit.     Family History  Problem Relation Age of Onset  . Depression Mother   . Pancreatitis Mother   . Alcohol abuse Father   . Depression Father   . Diabetes Father   . Early death Father   . Heart attack  Father   . Heart disease Father   . Pancreatitis Father   . Depression Sister   . Hyperlipidemia Sister   . Mental illness Sister   . Mental illness Daughter   . Migraines Daughter   . Depression Maternal Grandmother   . Early death Maternal Grandmother   . Hyperlipidemia Maternal Grandmother   . Hypertension Maternal Grandmother   . Stroke Maternal Grandmother   . Learning disabilities Maternal Grandmother   . Depression Maternal Grandfather   . Early death Maternal Grandfather   . Alcohol abuse Maternal Grandfather   . Learning disabilities Maternal Grandfather   . Epilepsy Maternal Grandfather   . Suicidality Maternal Grandfather   . Arthritis Paternal Grandmother   . Cancer Paternal Grandmother   . Hearing loss Paternal Grandmother   . Alcohol abuse Paternal Grandfather   . Hearing loss Paternal Grandfather   . Heart disease  Paternal Grandfather   . Depression Sister   . Depression Sister   PGM with breast cancer in her 52's.    Review of Systems  Constitutional: Negative.   HENT: Negative.   Eyes: Negative.   Respiratory: Negative.   Cardiovascular: Negative.   Gastrointestinal: Negative.   Endocrine: Negative.   Genitourinary:       Vaginal itching Vaginal odor  Musculoskeletal: Negative.   Skin: Negative.   Allergic/Immunologic: Negative.   Neurological: Negative.   Hematological: Negative.   Psychiatric/Behavioral: Negative.     Exam:   BP 124/80 (BP Location: Right Arm, Patient Position: Sitting, Cuff Size: Normal)   Pulse 72   Temp (!) 97.2 F (36.2 C) (Temporal)   Ht 5' 9"  (1.753 m)   Wt 165 lb (74.8 kg)   BMI 24.37 kg/m   Weight change: @WEIGHTCHANGE @ Height:   Height: 5' 9"  (175.3 cm)  Ht Readings from Last 3 Encounters:  04/22/19 5' 9"  (1.753 m)  12/26/18 5' 9"  (1.753 m)  09/03/18 5' 9"  (1.753 m)    General appearance: alert, cooperative and appears stated age Head: Normocephalic, without obvious abnormality, atraumatic Neck: no adenopathy, supple, symmetrical, trachea midline and thyroid normal to inspection and palpation Lungs: clear to auscultation bilaterally Cardiovascular: regular rate and rhythm Breasts: normal appearance, no masses or tenderness Abdomen: soft, non-tender; non distended,  no masses,  no organomegaly Extremities: extremities normal, atraumatic, no cyanosis or edema Skin: Skin color, texture, turgor normal. No rashes or lesions Lymph nodes: Cervical, supraclavicular, and axillary nodes normal. No abnormal inguinal nodes palpated Neurologic: Grossly normal   Pelvic: External genitalia:  no lesions              Urethra:  normal appearing urethra with no masses, tenderness or lesions              Bartholins and Skenes: normal                 Vagina: normal appearing vagina with normal color and discharge, no lesions              Cervix: no lesions                Bimanual Exam:  Uterus:  normal size, contour, position, consistency, mobility, non-tender and retroverted              Adnexa: no mass, fullness, tenderness               Rectovaginal: Confirms               Anus:  normal sphincter  tone, no lesions  Chaperone was present for exam.  Wet prep: + clue, no trich, rare wbc KOH: no yeast PH: 5   A:  Well Woman with normal exam  Bacterial vaginitis  Vasomotor symptoms, severe  H/O chronic nerve pain   H/O superficial thrombophlebitis  H/O Abnormal pap, due for repeat  P:   Pap with hpv  Treat BV with metrogel  Discussed vasomotor symptoms, no help with OTC agents  Discussed gabapentin, information given, will start  F/U in one month  Labs with primary  Mammogram due  Colonoscopy in 2015, not sure when she is due for repeat  Discussed breast self exam  Discussed calcium and vit D intake   06/04/19 addendum: Old records reviewed.  2/17 ASCUS, +HPV 3/17 [facility], CIN I 3/18 ASC-H, +HPV 4/18 [place] no dysplasia. Co testing recommended in 12 and 24 months. 5/19 Negative pap, negative hpv  Pap from this visit negative with negative HPV. Per [facility] f/u pap in 47 years  Old records say PP DVT, we have PP superficial thrombophlebitis.

## 2019-04-26 ENCOUNTER — Ambulatory Visit: Payer: BC Managed Care – PPO

## 2019-04-29 ENCOUNTER — Other Ambulatory Visit: Payer: Self-pay

## 2019-04-29 ENCOUNTER — Ambulatory Visit (INDEPENDENT_AMBULATORY_CARE_PROVIDER_SITE_OTHER): Payer: BC Managed Care – PPO

## 2019-04-29 DIAGNOSIS — I8393 Asymptomatic varicose veins of bilateral lower extremities: Secondary | ICD-10-CM | POA: Diagnosis not present

## 2019-04-29 NOTE — Progress Notes (Signed)
43m Asclera administered with a 27g butterfly.  Patient received a total of 4cc.  Treated most areas of concern. Easy access. Tolerated well. Anticipate good results. Patient only wanted 2 vials today and will return at a later date to complete other areas of concern.   Photos: Yes.    Compression stockings applied: Yes.

## 2019-04-30 LAB — CYTOLOGY - PAP
Comment: NEGATIVE
Diagnosis: NEGATIVE
Diagnosis: REACTIVE
High risk HPV: NEGATIVE

## 2019-05-02 ENCOUNTER — Other Ambulatory Visit: Payer: Self-pay

## 2019-05-02 MED ORDER — ZONISAMIDE 100 MG PO CAPS: 100.0000 mg | ORAL_CAPSULE | Freq: Every day | ORAL | 3 refills | Status: DC

## 2019-05-08 ENCOUNTER — Encounter: Payer: Self-pay | Admitting: Vascular Surgery

## 2019-05-10 ENCOUNTER — Encounter: Payer: Self-pay | Admitting: Vascular Surgery

## 2019-05-12 ENCOUNTER — Ambulatory Visit (INDEPENDENT_AMBULATORY_CARE_PROVIDER_SITE_OTHER)
Admission: RE | Admit: 2019-05-12 | Discharge: 2019-05-12 | Disposition: A | Discharge status: Home / Self Care | Payer: BC Managed Care – PPO | Source: Ambulatory Visit

## 2019-05-12 DIAGNOSIS — J019 Acute sinusitis, unspecified: Secondary | ICD-10-CM

## 2019-05-12 MED ORDER — AMOXICILLIN-POT CLAVULANATE 875-125 MG PO TABS: 1.0000 | ORAL_TABLET | Freq: Two times a day (BID) | ORAL | 0 refills | Status: DC

## 2019-05-12 MED ORDER — PREDNISONE 50 MG PO TABS: 50.0000 mg | ORAL_TABLET | Freq: Every day | ORAL | 0 refills | Status: AC

## 2019-05-12 MED ORDER — ALBUTEROL SULFATE HFA 108 (90 BASE) MCG/ACT IN AERS: 1.0000 | INHALATION_SPRAY | Freq: Four times a day (QID) | RESPIRATORY_TRACT | 0 refills | Status: DC | PRN

## 2019-05-12 NOTE — Discharge Instructions (Signed)
Augmentin twice daily for 1 week, take with food Prednisone daily x 5 days with food, in Am if possible Continue zyrtec, flonase May try mucinex  Drink plenty of fluids Follow up in person if not resolving

## 2019-05-12 NOTE — ED Provider Notes (Signed)
Virtual Visit via Video Note:  Carrie [name]  initiated request for Telemedicine visit with [facility]gent [facility] team. I connected with Carrie [name]  on 05/13/2019 at 10:09 AM  for a synchronized telemedicine visit using a video enabled HIPPA compliant telemedicine application. I verified that I am speaking with Carrie [name]  using two identifiers. Carrie Seal C Orlin Kann, PA-C  was physically located in a [facility] Urgent care site and Carrie [name] was located at a different location.   The limitations of evaluation and management by telemedicine as well as the availability of in-person appointments were discussed. Patient was informed that she  may incur a bill ( including co-pay) for this virtual visit encounter. Carrie [name]  expressed understanding and gave verbal consent to proceed with virtual visit.     History of Present Illness:Carrie [name]  is a 48 y.o. female presents for evaluation of sinus congestion.  Patient states that over the past couple weeks she has been struggling with allergies and nasal congestion.  On Tuesday and Wednesday her symptoms significantly worsened and has felt more congested as well as significant sinus pressure.  She has felt slightly nauseous, has had an associated sore throat.  Has had a headache that is located on bilateral frontal and retro-orbital areas.  She went and had a Covid swab done earlier today, but this is still pending.  She has had hot and cold chills, denies any known fevers.  She has been using Zyrtec, Flonase, Aleve and Advil which have no longer been working which typically do for her allergies.  She denies any significant cough.  Denies shortness of breath or chest pain.  Denies known exposure to Covid.       Allergies  Allergen Reactions  . Gluten Meal   . Oseltamivir Other (See Comments)    Severe jaw Pain  . Other Other (See Comments)    Beef Causes stomach pain  . Tamiflu [Oseltamivir Phosphate]   . Zofran  [Ondansetron]     rash     Past Medical History:  Diagnosis Date  . Amenorrhea   . Arthritis   . Asthma   . Colon polyps   . Depression   . Frequent headaches   . Frequent refractory urinary tract infections   . Hay fever   . History of chicken pox   . Superficial thrombophlebitis    Post partum  . Thyroid disease      Social History   Tobacco Use  . Smoking status: Never Smoker  . Smokeless tobacco: Never Used  Substance Use Topics  . Alcohol use: Yes    Comment: 0-2 drinks a week  . Drug use: Never        Observations/Objective: Physical Exam  Constitutional: She is oriented to person, place, and time and well-developed, well-nourished, and in no distress. No distress.  No acute distress, sitting at desk  HENT:  Head: Normocephalic and atraumatic.  Neck: Normal range of motion.  Pulmonary/Chest: Effort normal. No respiratory distress.  Speaking in full sentences, no coughing during video  Musculoskeletal:     Comments: Moving extremities appropriately  Neurological: She is alert and oriented to person, place, and time.  Speech clear, face symmetric     Assessment and Plan:    ICD-10-CM   1. Acute sinusitis with symptoms > 10 days  J01.90      Patient with over a week of allergy symptoms with recent worsening with significant sinus pressure.  Will treat for sinusitis today  with Augmentin, also providing prednisone and refill of albuterol inhaler for patient for asthma.  Covid swab pending.  Recommended to quarantine until results return.  Follow-up in person if symptoms not improving or worsening with medicines provided today.  Follow Up Instructions:     I discussed the assessment and treatment plan with the patient. The patient was provided an opportunity to ask questions and all were answered. The patient agreed with the plan and demonstrated an understanding of the instructions.   The patient was advised to call back or seek an in-person evaluation  if the symptoms worsen or if the condition fails to improve as anticipated.      Janith Lima, PA-C  05/13/2019 10:09 AM           Debara Pickett C, PA-C 05/13/19 1010

## 2019-05-20 DIAGNOSIS — S73199A Other sprain of unspecified hip, initial encounter: Secondary | ICD-10-CM | POA: Insufficient documentation

## 2019-06-01 ENCOUNTER — Encounter: Payer: Self-pay | Admitting: Obstetrics and Gynecology

## 2019-06-03 ENCOUNTER — Telehealth: Payer: Self-pay | Admitting: Obstetrics and Gynecology

## 2019-06-03 NOTE — Telephone Encounter (Signed)
   °  °  Hi Dr Lyndon Code- I have had a reoccurrence of the symptoms I was having when I came in for my last appointment- vaginal odor with a little discomfort. I used the gel you prescribed, however in the middle of the course of treatment I had to go out of town unexpectedly and forgot it at home. There was a 2 day gap after day 2 before I resumed using it. My symptoms improved for a short time but have come back and I know it is likely my fault due to the inconsistency. Is it possible to get a refill or do I need to come in again?

## 2019-06-03 NOTE — Progress Notes (Deleted)
GYNECOLOGY  VISIT   HPI: 48 y.o.   Married White or Caucasian Not Hispanic or Latino  female   [id number] with No LMP recorded. Patient has had an ablation.   here for     GYNECOLOGIC HISTORY: No LMP recorded. Patient has had an ablation. Contraception:*** Menopausal hormone therapy: ***        OB History    Gravida  3   Para  3   Term  3   Preterm      AB      Living  3     SAB      TAB      Ectopic      Multiple      Live Births  3              Patient Active Problem List   Diagnosis Date Noted  . Constipation 09/03/2018  . Varicose veins of both lower extremities with pain 04/20/2018  . Migraines 04/13/2018  . Hypothyroidism 04/13/2018  . Major depression in remission ([place]) 04/13/2018  . History of asthma 04/13/2018    Past Medical History:  Diagnosis Date  . Amenorrhea   . Arthritis   . Asthma   . Colon polyps   . Depression   . Frequent headaches   . Frequent refractory urinary tract infections   . Hay fever   . History of chicken pox   . Superficial thrombophlebitis    Post partum  . Thyroid disease     Past Surgical History:  Procedure Laterality Date  . ADENOIDECTOMY  1975  . BREAST BIOPSY  1995  . CERVICAL DISCECTOMY  2003  . CHOLECYSTECTOMY  2013  . COLPOSCOPY    . ENDOMETRIAL ABLATION    . THYROIDECTOMY  2018  . TUBAL LIGATION      Current Outpatient Medications  Medication Sig Dispense Refill  . albuterol (VENTOLIN HFA) 108 (90 Base) MCG/ACT inhaler Inhale 2 puffs into the lungs every 4 (four) hours as needed for shortness of breath. 8 g 2  . albuterol (VENTOLIN HFA) 108 (90 Base) MCG/ACT inhaler Inhale 1-2 puffs into the lungs every 6 (six) hours as needed for wheezing or shortness of breath. 18 g 0  . amoxicillin-clavulanate (AUGMENTIN) 875-125 MG tablet Take 1 tablet by mouth every 12 (twelve) hours. 14 tablet 0  . ARMOUR THYROID 120 MG tablet Take 120 mg by mouth daily.  0  . buPROPion (WELLBUTRIN XL) 300 MG 24 hr  tablet Take 1 tablet (300 mg total) by mouth daily. 90 tablet 3  . gabapentin (NEURONTIN) 100 MG capsule Take one tablet at hs, if tolerating after 3 days increase to 2 tablets at hs, if tolerating after 3 days increase to 3 tablets at hs. 90 capsule 1  . linaclotide (LINZESS) 145 MCG CAPS capsule Take 2 capsules (290 mcg total) by mouth daily before breakfast. 60 capsule 3  . liothyronine (CYTOMEL) 5 MCG tablet Take 5 mcg by mouth 2 (two) times daily.    . metroNIDAZOLE (METROGEL) 0.75 % vaginal gel Place 1 Applicatorful vaginally at bedtime. Use for 5 nights 70 g 0  . Multiple Vitamin (MULTIVITAMIN) tablet Take 1 tablet by mouth daily.    . SUMAtriptan (IMITREX) 100 MG tablet Take 1 tablet (100 mg total) by mouth daily as needed. 10 tablet 0  . zonisamide (ZONEGRAN) 100 MG capsule Take 1 capsule (100 mg total) by mouth daily. 30 capsule 3   No current facility-administered medications for this visit.  ALLERGIES: Gluten meal, Oseltamivir, Other, Tamiflu [oseltamivir phosphate], and Zofran [ondansetron]  Family History  Problem Relation Age of Onset  . Depression Mother   . Pancreatitis Mother   . Alcohol abuse Father   . Depression Father   . Diabetes Father   . Early death Father   . Heart attack Father   . Heart disease Father   . Pancreatitis Father   . Depression Sister   . Hyperlipidemia Sister   . Mental illness Sister   . Mental illness Daughter   . Migraines Daughter   . Depression Maternal Grandmother   . Early death Maternal Grandmother   . Hyperlipidemia Maternal Grandmother   . Hypertension Maternal Grandmother   . Stroke Maternal Grandmother   . Learning disabilities Maternal Grandmother   . Depression Maternal Grandfather   . Early death Maternal Grandfather   . Alcohol abuse Maternal Grandfather   . Learning disabilities Maternal Grandfather   . Epilepsy Maternal Grandfather   . Suicidality Maternal Grandfather   . Arthritis Paternal Grandmother   .  Cancer Paternal Grandmother   . Hearing loss Paternal Grandmother   . Alcohol abuse Paternal Grandfather   . Hearing loss Paternal Grandfather   . Heart disease Paternal Grandfather   . Depression Sister   . Depression Sister     Social History   Socioeconomic History  . Marital status: Married    Spouse name: Not on file  . Number of children: Not on file  . Years of education: Not on file  . Highest education level: Not on file  Occupational History  . Not on file  Social Needs  . Financial resource strain: Not on file  . Food insecurity    Worry: Not on file    Inability: Not on file  . Transportation needs    Medical: Not on file    Non-medical: Not on file  Tobacco Use  . Smoking status: Never Smoker  . Smokeless tobacco: Never Used  Substance and Sexual Activity  . Alcohol use: Yes    Comment: 0-2 drinks a week  . Drug use: Never  . Sexual activity: Yes    Partners: Male    Birth control/protection: Surgical    Comment: tubal ligation  Lifestyle  . Physical activity    Days per week: Not on file    Minutes per session: Not on file  . Stress: Not on file  Relationships  . Social Herbalist on phone: Not on file    Gets together: Not on file    Attends religious service: Not on file    Active member of club or organization: Not on file    Attends meetings of clubs or organizations: Not on file    Relationship status: Not on file  . Intimate partner violence    Fear of current or ex partner: Not on file    Emotionally abused: Not on file    Physically abused: Not on file    Forced sexual activity: Not on file  Other Topics Concern  . Not on file  Social History Narrative  . Not on file    ROS  PHYSICAL EXAMINATION:    There were no vitals taken for this visit.    General appearance: alert, cooperative and appears stated age Neck: no adenopathy, supple, symmetrical, trachea midline and thyroid {CHL AMB PHY EX THYROID NORM  DEFAULT:[phone]::"normal to inspection and palpation"} Breasts: {Exam; breast:13139::"normal appearance, no masses or tenderness"} Abdomen: soft, non-tender;  non distended, no masses,  no organomegaly  Pelvic: External genitalia:  no lesions              Urethra:  normal appearing urethra with no masses, tenderness or lesions              Bartholins and Skenes: normal                 Vagina: normal appearing vagina with normal color and discharge, no lesions              Cervix: {CHL AMB PHY EX CERVIX NORM DEFAULT:[phone]::"no lesions"}              Bimanual Exam:  Uterus:  {CHL AMB PHY EX UTERUS NORM [phone]::"normal size, contour, position, consistency, mobility, non-tender"}              Adnexa: {CHL AMB PHY EX ADNEXA NO MASS DEFAULT:[phone]::"no mass, fullness, tenderness"}              Rectovaginal: {yes no:[id number]}.  Confirms.              Anus:  normal sphincter tone, no lesions  Chaperone was present for exam.  ASSESSMENT     PLAN    An After Visit Summary was printed and given to the patient.  *** minutes face to face time of which over 50% was spent in counseling.

## 2019-06-03 NOTE — Telephone Encounter (Signed)
Spoke with pt. Pt states still having BV sx of discomfort and odor since being inconsistent with Metrogel at last OV on 04/22/2019. Pt came back from out of town and finished metrogel 1.5 weeks ago. Pt still having sx. Pt agrees with OV on 11/24 at 2:30pm.   Routing to provider for final review. Patient is agreeable to disposition. Will close encounter.

## 2019-06-04 ENCOUNTER — Telehealth: Payer: Self-pay | Admitting: Obstetrics and Gynecology

## 2019-06-04 ENCOUNTER — Encounter: Payer: Self-pay | Admitting: Obstetrics and Gynecology

## 2019-06-04 ENCOUNTER — Ambulatory Visit: Payer: Self-pay | Admitting: Obstetrics and Gynecology

## 2019-06-04 NOTE — Telephone Encounter (Signed)
Please let the patient know that I got a copy of her old records. Her next pap and hpv are due in 3 years. Please place in 36 month recall.

## 2019-06-04 NOTE — Telephone Encounter (Signed)
Left message to call Kaitlyn at [phone]. 

## 2019-06-11 ENCOUNTER — Encounter: Payer: Self-pay | Admitting: Obstetrics and Gynecology

## 2019-06-11 NOTE — Telephone Encounter (Signed)
Left message to call Teana Lindahl at [phone]. 

## 2019-06-18 ENCOUNTER — Telehealth: Payer: Self-pay

## 2019-06-18 NOTE — Telephone Encounter (Signed)
Left message for pt to return call d/t missed appt on 06/11/19 with Dr Talbert Nan for BV sx.

## 2019-06-26 ENCOUNTER — Ambulatory Visit (INDEPENDENT_AMBULATORY_CARE_PROVIDER_SITE_OTHER): Payer: BC Managed Care – PPO | Admitting: Family Medicine

## 2019-06-26 ENCOUNTER — Encounter: Payer: Self-pay | Admitting: Family Medicine

## 2019-06-26 DIAGNOSIS — R350 Frequency of micturition: Secondary | ICD-10-CM

## 2019-06-26 MED ORDER — CEPHALEXIN 500 MG PO CAPS: 500.0000 mg | ORAL_CAPSULE | Freq: Two times a day (BID) | ORAL | 0 refills | Status: AC

## 2019-06-26 NOTE — Progress Notes (Signed)
    Chief Complaint:  Carrie [name] is a 48 y.o. female who presents today for a virtual office visit with a chief complaint of urinary frequency .   Assessment/Plan:  Urinary frequency History consistent with prior UTIs.  No red flags.  Will start empiric Keflex.  Will check urine culture.  Encouraged good oral hydration.  Discussed reasons return to care.    Subjective:  HPI:  Symptoms started about 3 days ago. Symptoms include urgency and frequency. No dysuria. Feels like prior UTIs. No fevers or chills. No abdominal pain. No nausea or vomiting. No back pain.  Symptoms worsening.  No other obvious aggravating or alleviating factors.  ROS: Per HPI  PMH: She reports that she has never smoked. She has never used smokeless tobacco. She reports current alcohol use. She reports that she does not use drugs.      Objective/Observations  Physical Exam: Gen: NAD, resting comfortably Pulm: Normal work of breathing Neuro: Grossly normal, moves all extremities Psych: Normal affect and thought content  No results found for this or any previous visit (from the past 24 hour(s)).   Virtual Visit via Video   I connected with Carrie [name] on 06/26/19 at 11:00 AM EST by a video enabled telemedicine application and verified that I am speaking with the correct person using two identifiers. The limitations of evaluation and management by telemedicine and the availability of in person appointments were discussed. The patient expressed understanding and agreed to proceed.   Patient location: Home Provider location: [place] participating in the virtual visit: Myself and Patient     [name]. [name], MD 06/26/2019 11:05 AM

## 2019-06-26 NOTE — Telephone Encounter (Signed)
Mychart message sent to patient with information from [place].

## 2019-07-10 ENCOUNTER — Encounter: Payer: Self-pay | Admitting: Family Medicine

## 2019-07-10 ENCOUNTER — Ambulatory Visit (INDEPENDENT_AMBULATORY_CARE_PROVIDER_SITE_OTHER): Payer: BC Managed Care – PPO | Admitting: Family Medicine

## 2019-07-10 ENCOUNTER — Other Ambulatory Visit: Payer: Self-pay

## 2019-07-10 VITALS — BP 110/68 | HR 81 | Temp 97.7°F | Ht 69.0 in | Wt 168.0 lb

## 2019-07-10 DIAGNOSIS — Z13220 Encounter for screening for lipoid disorders: Secondary | ICD-10-CM

## 2019-07-10 DIAGNOSIS — G43909 Migraine, unspecified, not intractable, without status migrainosus: Secondary | ICD-10-CM

## 2019-07-10 DIAGNOSIS — F325 Major depressive disorder, single episode, in full remission: Secondary | ICD-10-CM

## 2019-07-10 DIAGNOSIS — Z0001 Encounter for general adult medical examination with abnormal findings: Secondary | ICD-10-CM | POA: Diagnosis not present

## 2019-07-10 DIAGNOSIS — R350 Frequency of micturition: Secondary | ICD-10-CM | POA: Diagnosis not present

## 2019-07-10 DIAGNOSIS — K5900 Constipation, unspecified: Secondary | ICD-10-CM

## 2019-07-10 DIAGNOSIS — M25552 Pain in left hip: Secondary | ICD-10-CM

## 2019-07-10 LAB — COMPREHENSIVE METABOLIC PANEL
ALT: 19 U/L (ref 0–35)
AST: 14 U/L (ref 0–37)
Albumin: 4 g/dL (ref 3.5–5.2)
Alkaline Phosphatase: 74 U/L (ref 39–117)
BUN: 10 mg/dL (ref 6–23)
CO2: 26 mEq/L (ref 19–32)
Calcium: 8.6 mg/dL (ref 8.4–10.5)
Chloride: 106 mEq/L (ref 96–112)
Creatinine, Ser: 0.86 mg/dL (ref 0.40–1.20)
GFR: 70.63 mL/min (ref >60.00)
Glucose, Bld: 86 mg/dL (ref 70–99)
Potassium: 3.7 mEq/L (ref 3.5–5.1)
Sodium: 139 mEq/L (ref 135–145)
Total Bilirubin: 0.7 mg/dL (ref 0.2–1.2)
Total Protein: 6.1 g/dL (ref 6.0–8.3)

## 2019-07-10 LAB — URINALYSIS, ROUTINE W REFLEX MICROSCOPIC
Bilirubin Urine: NEGATIVE
Hgb urine dipstick: NEGATIVE
Ketones, ur: NEGATIVE
Leukocytes,Ua: NEGATIVE
Nitrite: NEGATIVE
RBC / HPF: NONE SEEN (ref 0–?)
Specific Gravity, Urine: 1.015 (ref 1.000–1.030)
Total Protein, Urine: NEGATIVE
Urine Glucose: NEGATIVE
Urobilinogen, UA: 0.2 (ref 0.0–1.0)
pH: 7 (ref 5.0–8.0)

## 2019-07-10 LAB — LIPID PANEL
Cholesterol: 208 mg/dL — ABNORMAL HIGH (ref 0–200)
HDL: 55.2 mg/dL (ref >39.00)
LDL Cholesterol: 140 mg/dL — ABNORMAL HIGH (ref 0–99)
NonHDL: 153.04
Total CHOL/HDL Ratio: 4
Triglycerides: 63 mg/dL (ref 0.0–149.0)
VLDL: 12.6 mg/dL (ref 0.0–40.0)

## 2019-07-10 LAB — CBC
HCT: 42.6 % (ref 36.0–46.0)
Hemoglobin: 14.2 g/dL (ref 12.0–15.0)
MCHC: 33.4 g/dL (ref 30.0–36.0)
MCV: 93 fl (ref 78.0–100.0)
Platelets: 315 10*3/uL (ref 150.0–400.0)
RBC: 4.58 Mil/uL (ref 3.87–5.11)
RDW: 12.8 % (ref 11.5–15.5)
WBC: 5.2 10*3/uL (ref 4.0–10.5)

## 2019-07-10 MED ORDER — LUBIPROSTONE 24 MCG PO CAPS: 24.0000 ug | ORAL_CAPSULE | Freq: Two times a day (BID) | ORAL | 5 refills | Status: DC

## 2019-07-10 NOTE — Assessment & Plan Note (Signed)
Continue Zonegran 100 mg daily and Imitrex as needed.

## 2019-07-10 NOTE — Patient Instructions (Signed)
It was very nice to see you today!  We will check blood work and a urine sample today.  Please try the Amitiza.  Come back in 1 year for your next checkup, or sooner if needed.  Take care, Dr [name]  Please try these tips to maintain a healthy lifestyle:   Eat at least 3 REAL meals and 1-2 snacks per day.  Aim for no more than 5 hours between eating.  If you eat breakfast, please do so within one hour of getting up.    Each meal should contain half fruits/vegetables, one quarter protein, and one quarter carbs (no bigger than a computer mouse)   Cut down on sweet beverages. This includes juice, soda, and sweet tea.     Drink at least 1 glass of water with each meal and aim for at least 8 glasses per day   Exercise at least 150 minutes every week.    Preventive Care 29-40 Years Old, Female Preventive care refers to visits with your health care provider and lifestyle choices that can promote health and wellness. This includes:  A yearly physical exam. This may also be called an annual well check.  Regular dental visits and eye exams.  Immunizations.  Screening for certain conditions.  Healthy lifestyle choices, such as eating a healthy diet, getting regular exercise, not using drugs or products that contain nicotine and tobacco, and limiting alcohol use. What can I expect for my preventive care visit? Physical exam Your health care provider will check your:  Height and weight. This may be used to calculate body mass index (BMI), which tells if you are at a healthy weight.  Heart rate and blood pressure.  Skin for abnormal spots. Counseling Your health care provider may ask you questions about your:  Alcohol, tobacco, and drug use.  Emotional well-being.  Home and relationship well-being.  Sexual activity.  Eating habits.  Work and work Statistician.  Method of birth control.  Menstrual cycle.  Pregnancy history. What immunizations do I  need?  Influenza (flu) vaccine  This is recommended every year. Tetanus, diphtheria, and pertussis (Tdap) vaccine  You may need a Td booster every 10 years. Varicella (chickenpox) vaccine  You may need this if you have not been vaccinated. Zoster (shingles) vaccine  You may need this after age 78. Measles, mumps, and rubella (MMR) vaccine  You may need at least one dose of MMR if you were born in 1957 or later. You may also need a second dose. Pneumococcal conjugate (PCV13) vaccine  You may need this if you have certain conditions and were not previously vaccinated. Pneumococcal polysaccharide (PPSV23) vaccine  You may need one or two doses if you smoke cigarettes or if you have certain conditions. Meningococcal conjugate (MenACWY) vaccine  You may need this if you have certain conditions. Hepatitis A vaccine  You may need this if you have certain conditions or if you travel or work in places where you may be exposed to hepatitis A. Hepatitis B vaccine  You may need this if you have certain conditions or if you travel or work in places where you may be exposed to hepatitis B. Haemophilus influenzae type b (Hib) vaccine  You may need this if you have certain conditions. Human papillomavirus (HPV) vaccine  If recommended by your health care provider, you may need three doses over 6 months. You may receive vaccines as individual doses or as more than one vaccine together in one shot (combination vaccines). Talk with  your health care provider about the risks and benefits of combination vaccines. What tests do I need? Blood tests  Lipid and cholesterol levels. These may be checked every 5 years, or more frequently if you are over 47 years old.  Hepatitis C test.  Hepatitis B test. Screening  Lung cancer screening. You may have this screening every year starting at age 48 if you have a 30-pack-year history of smoking and currently smoke or have quit within the past 15  years.  Colorectal cancer screening. All adults should have this screening starting at age 73 and continuing until age 85. Your health care provider may recommend screening at age 38 if you are at increased risk. You will have tests every 1-10 years, depending on your results and the type of screening test.  Diabetes screening. This is done by checking your blood sugar (glucose) after you have not eaten for a while (fasting). You may have this done every 1-3 years.  Mammogram. This may be done every 1-2 years. Talk with your health care provider about when you should start having regular mammograms. This may depend on whether you have a family history of breast cancer.  BRCA-related cancer screening. This may be done if you have a family history of breast, ovarian, tubal, or peritoneal cancers.  Pelvic exam and Pap test. This may be done every 3 years starting at age 49. Starting at age 51, this may be done every 5 years if you have a Pap test in combination with an HPV test. Other tests  Sexually transmitted disease (STD) testing.  Bone density scan. This is done to screen for osteoporosis. You may have this scan if you are at high risk for osteoporosis. Follow these instructions at home: Eating and drinking  Eat a diet that includes fresh fruits and vegetables, whole grains, lean protein, and low-fat dairy.  Take vitamin and mineral supplements as recommended by your health care provider.  Do not drink alcohol if: ? Your health care provider tells you not to drink. ? You are pregnant, may be pregnant, or are planning to become pregnant.  If you drink alcohol: ? Limit how much you have to 0-1 drink a day. ? Be aware of how much alcohol is in your drink. In the [place] one drink equals one 12 oz bottle of beer (355 mL), one 5 oz glass of wine (148 mL), or one 1 oz glass of hard liquor (44 mL). Lifestyle  Take daily care of your teeth and gums.  Stay active. Exercise for at least 30  minutes on 5 or more days each week.  Do not use any products that contain nicotine or tobacco, such as cigarettes, e-cigarettes, and chewing tobacco. If you need help quitting, ask your health care provider.  If you are sexually active, practice safe sex. Use a condom or other form of birth control (contraception) in order to prevent pregnancy and STIs (sexually transmitted infections).  If told by your health care provider, take low-dose aspirin daily starting at age 1. What's next?  Visit your health care provider once a year for a well check visit.  Ask your health care provider how often you should have your eyes and teeth checked.  Stay up to date on all vaccines. This information is not intended to replace advice given to you by your health care provider. Make sure you discuss any questions you have with your health care provider. Document Released: 07/24/2015 Document Revised: 03/08/2018 Document Reviewed: 03/08/2018 Elsevier  Patient Education  [employer].

## 2019-07-10 NOTE — Assessment & Plan Note (Signed)
Continue management per orthopedics.

## 2019-07-10 NOTE — Assessment & Plan Note (Signed)
Try Amitiza.  If no improvement will need GI referral.

## 2019-07-10 NOTE — Assessment & Plan Note (Signed)
Stable.  Continue Wellbutrin 300 mg daily.

## 2019-07-10 NOTE — Progress Notes (Signed)
Chief Complaint:  Carrie [name] is a 48 y.o. female who presents today for her annual comprehensive physical exam.    Assessment/Plan:  Major depression in remission ([place]) Stable.  Continue Wellbutrin 300 mg daily.  Constipation Try Amitiza.  If no improvement will need GI referral.  Migraines Continue Zonegran 100 mg daily and Imitrex as needed.  Left hip pain Continue management per orthopedics.  Preventative Healthcare: Up-to-date on vaccines and screenings.  Patient Counseling(The following topics were reviewed and/or handout was given):  -Nutrition: Stressed importance of moderation in sodium/caffeine intake, saturated fat and cholesterol, caloric balance, sufficient intake of fresh fruits, vegetables, and fiber.  -Stressed the importance of regular exercise.   -Substance Abuse: Discussed cessation/primary prevention of tobacco, alcohol, or other drug use; driving or other dangerous activities under the influence; availability of treatment for abuse.   -Injury prevention: Discussed safety belts, safety helmets, smoke detector, smoking near bedding or upholstery.   -Sexuality: Discussed sexually transmitted diseases, partner selection, use of condoms, avoidance of unintended pregnancy and contraceptive alternatives.   -Dental health: Discussed importance of regular tooth brushing, flossing, and dental visits.  -Health maintenance and immunizations reviewed. Please refer to Health maintenance section.  Return to care in 1 year for next preventative visit.     Subjective:  HPI:  She has no acute complaints today.   She still has ongoing issues with constipation.  Tried Linzess about a year ago worked well for couple months then stopped working.  She was also recently treated for UTI.  Symptoms seem to be improving  She has been seeing orthopedics for left hip pain.  Was diagnosed with labral tear.  Is considering surgery.  Her stable, chronic medical conditions are  outlined below:   # Migraines - Uses zonegran 148m daily - Uses imitrex as needed  # Depression - On wellbutrin 3064mdaily and tolerating well  Lifestyle Diet: Balanced. Exercise: Tries to exercise daily. Likes to hike.   Depression screen PHQ 2/9 07/10/2019  Decreased Interest 0  Down, Depressed, Hopeless 1  PHQ - 2 Score 1  Altered sleeping 1  Tired, decreased energy 1  Change in appetite 2  Feeling bad or failure about yourself  0  Trouble concentrating 0  Moving slowly or fidgety/restless 0  Suicidal thoughts 0  PHQ-9 Score 5  Difficult doing work/chores Not difficult at all    Health Maintenance Due  Topic Date Due  . HIV Screening  03/10/1987     ROS: Per HPI, otherwise a complete review of systems was negative.   PMH:  The following were reviewed and entered/updated in epic: Past Medical History:  Diagnosis Date  . Amenorrhea   . Arthritis   . Asthma   . Colon polyps   . Depression   . Frequent headaches   . Frequent refractory urinary tract infections   . Hay fever   . History of chicken pox   . Superficial thrombophlebitis    Post partum  . Thyroid disease    Patient Active Problem List   Diagnosis Date Noted  . Left hip pain 07/10/2019  . Constipation 09/03/2018  . Varicose veins of both lower extremities with pain 04/20/2018  . Migraines 04/13/2018  . Hypothyroidism 04/13/2018  . [place]  . History of asthma 04/13/2018   Past Surgical History:  Procedure Laterality Date  . ADENOIDECTOMY  1975  . BREAST BIOPSY  1995  . CERVICAL DISCECTOMY  2003  . CHOLECYSTECTOMY  2013  .  COLPOSCOPY    . ENDOMETRIAL ABLATION    . THYROIDECTOMY  2018  . TUBAL LIGATION      Family History  Problem Relation Age of Onset  . Depression Mother   . Pancreatitis Mother   . Alcohol abuse Father   . Depression Father   . Diabetes Father   . Early death Father   . Heart attack Father   . Heart disease Father     . Pancreatitis Father   . Depression Sister   . Hyperlipidemia Sister   . Mental illness Sister   . Mental illness Daughter   . Migraines Daughter   . Depression Maternal Grandmother   . Early death Maternal Grandmother   . Hyperlipidemia Maternal Grandmother   . Hypertension Maternal Grandmother   . Stroke Maternal Grandmother   . Learning disabilities Maternal Grandmother   . Depression Maternal Grandfather   . Early death Maternal Grandfather   . Alcohol abuse Maternal Grandfather   . Learning disabilities Maternal Grandfather   . Epilepsy Maternal Grandfather   . Suicidality Maternal Grandfather   . Arthritis Paternal Grandmother   . Cancer Paternal Grandmother   . Hearing loss Paternal Grandmother   . Alcohol abuse Paternal Grandfather   . Hearing loss Paternal Grandfather   . Heart disease Paternal Grandfather   . Depression Sister   . Depression Sister     Medications- reviewed and updated Current Outpatient Medications  Medication Sig Dispense Refill  . albuterol (VENTOLIN HFA) 108 (90 Base) MCG/ACT inhaler Inhale 2 puffs into the lungs every 4 (four) hours as needed for shortness of breath. 8 g 2  . albuterol (VENTOLIN HFA) 108 (90 Base) MCG/ACT inhaler Inhale 1-2 puffs into the lungs every 6 (six) hours as needed for wheezing or shortness of breath. 18 g 0  . ARMOUR THYROID 120 MG tablet Take 120 mg by mouth daily.  0  . buPROPion (WELLBUTRIN XL) 300 MG 24 hr tablet Take 1 tablet (300 mg total) by mouth daily. 90 tablet 3  . liothyronine (CYTOMEL) 5 MCG tablet Take 5 mcg by mouth 2 (two) times daily.    . Multiple Vitamin (MULTIVITAMIN) tablet Take 1 tablet by mouth daily.    . SUMAtriptan (IMITREX) 100 MG tablet Take 1 tablet (100 mg total) by mouth daily as needed. 10 tablet 0  . zonisamide (ZONEGRAN) 100 MG capsule Take 1 capsule (100 mg total) by mouth daily. 30 capsule 3  . lubiprostone (AMITIZA) 24 MCG capsule Take 1 capsule (24 mcg total) by mouth 2 (two)  times daily with a meal. 60 capsule 5   No current facility-administered medications for this visit.    Allergies-reviewed and updated Allergies  Allergen Reactions  . Gluten Meal   . Oseltamivir Other (See Comments)    Severe jaw Pain  . Other Other (See Comments)    Beef Causes stomach pain  . Tamiflu [Oseltamivir Phosphate]   . Zofran [Ondansetron]     rash    Social History   Socioeconomic History  . Marital status: Married    Spouse name: Not on file  . Number of children: Not on file  . Years of education: Not on file  . Highest education level: Not on file  Occupational History  . Not on file  Tobacco Use  . Smoking status: Never Smoker  . Smokeless tobacco: Never Used  Substance and Sexual Activity  . Alcohol use: Yes    Comment: 0-2 drinks a week  . Drug  use: Never  . Sexual activity: Yes    Partners: Male    Birth control/protection: Surgical    Comment: tubal ligation  Other Topics Concern  . Not on file  Social History Narrative  . Not on file   Social Determinants of Health   Financial Resource Strain:   . Difficulty of Paying Living Expenses: Not on file  Food Insecurity:   . Worried About Charity fundraiser in the Last Year: Not on file  . Ran Out of Food in the Last Year: Not on file  Transportation Needs:   . Lack of Transportation (Medical): Not on file  . Lack of Transportation (Non-Medical): Not on file  Physical Activity:   . Days of Exercise per Week: Not on file  . Minutes of Exercise per Session: Not on file  Stress:   . Feeling of Stress : Not on file  Social Connections:   . Frequency of Communication with Friends and Family: Not on file  . Frequency of Social Gatherings with Friends and Family: Not on file  . Attends Religious Services: Not on file  . Active Member of Clubs or Organizations: Not on file  . Attends Archivist Meetings: Not on file  . Marital Status: Not on file        Objective:  Physical  Exam: BP 110/68   Pulse 81   Temp 97.7 F (36.5 C)   Ht 5' 9"  (1.753 m)   Wt 168 lb (76.2 kg)   SpO2 99%   BMI 24.81 kg/m   Body mass index is 24.81 kg/m. Wt Readings from Last 3 Encounters:  07/10/19 168 lb (76.2 kg)  04/22/19 165 lb (74.8 kg)  12/26/18 170 lb (77.1 kg)   Gen: NAD, resting comfortably HEENT: TMs normal bilaterally. OP clear. No thyromegaly noted.  CV: RRR with no murmurs appreciated Pulm: NWOB, CTAB with no crackles, wheezes, or rhonchi GI: Normal bowel sounds present. Soft, Nontender, Nondistended. MSK: no edema, cyanosis, or clubbing noted Skin: warm, dry Neuro: CN2-12 grossly intact. Strength 5/5 in upper and lower extremities. Reflexes symmetric and intact bilaterally.  Psych: Normal affect and thought content     [place] Surges M. [name], MD 07/10/2019 9:14 AM

## 2019-07-12 LAB — URINE CULTURE
MICRO NUMBER:: 1241158
Result:: NO GROWTH
SPECIMEN QUALITY:: ADEQUATE

## 2019-07-15 ENCOUNTER — Encounter: Payer: Self-pay | Admitting: Family Medicine

## 2019-07-15 DIAGNOSIS — E785 Hyperlipidemia, unspecified: Secondary | ICD-10-CM | POA: Insufficient documentation

## 2019-07-15 NOTE — Progress Notes (Signed)
Please inform patient of the following:  Cholesterol is mildly elevated but the rest of her labs are NORMAL. No signs of a UTI. Would like for her to continue working on diet and exercise and we can recheck in a year or so.

## 2019-07-22 ENCOUNTER — Telehealth: Payer: Self-pay | Admitting: Family Medicine

## 2019-07-22 NOTE — Telephone Encounter (Signed)
Pt called to return [place] phone call. Please advise.

## 2019-07-23 NOTE — Telephone Encounter (Signed)
See Lab Note

## 2019-07-29 ENCOUNTER — Encounter: Payer: Self-pay | Admitting: Family Medicine

## 2019-07-30 ENCOUNTER — Other Ambulatory Visit: Payer: Self-pay

## 2019-07-30 MED ORDER — CYCLOBENZAPRINE HCL 10 MG PO TABS: 10.0000 mg | ORAL_TABLET | Freq: Three times a day (TID) | ORAL | 1 refills | Status: DC | PRN

## 2019-08-05 ENCOUNTER — Other Ambulatory Visit: Payer: Self-pay

## 2019-08-05 ENCOUNTER — Ambulatory Visit (INDEPENDENT_AMBULATORY_CARE_PROVIDER_SITE_OTHER): Payer: BC Managed Care – PPO | Admitting: Family Medicine

## 2019-08-05 DIAGNOSIS — G43909 Migraine, unspecified, not intractable, without status migrainosus: Secondary | ICD-10-CM | POA: Diagnosis not present

## 2019-08-05 MED ORDER — DICLOFENAC SODIUM 75 MG PO TBEC: 75.0000 mg | DELAYED_RELEASE_TABLET | Freq: Every day | ORAL | 0 refills | Status: DC | PRN

## 2019-08-05 MED ORDER — ZONISAMIDE 100 MG PO CAPS: 100.0000 mg | ORAL_CAPSULE | Freq: Every day | ORAL | 3 refills | Status: DC

## 2019-08-05 NOTE — Progress Notes (Signed)
   Carrie [name] is a 49 y.o. female who presents today for a virtual office visit.  Assessment/Plan:  Chronic Problems Addressed Today: Migraines Uncontrolled.  No red flags.  We will continue Zonegran 100 mg daily.  We will send in additional abortive therapy with diclofenac 25 mg daily.  Advised her to take with Imitrex and 25 mg of Benadryl as early in her course of migrain as possible.    Subjective:  HPI:  Migraines got a lot worse recently. Had nearly everyday for about 9 days in a row. No obvious triggering events. Currently taking zonisamide daily.  Imitrex helped modestly.  Aleve is worked well in the past.        Objective/Observations  Physical Exam: Gen: NAD, resting comfortably Pulm: Normal work of breathing Neuro: Grossly normal, moves all extremities Psych: Normal affect and thought content  Virtual Visit via Video   I connected with Carrie [name] on 08/05/19 at  3:40 PM EST by a video enabled telemedicine application and verified that I am speaking with the correct person using two identifiers. The limitations of evaluation and management by telemedicine and the availability of in person appointments were discussed. The patient expressed understanding and agreed to proceed.   Patient location: Home Provider location: [place] participating in the virtual visit: Myself and Patient     [name]. [name], MD 08/05/2019 3:32 PM

## 2019-08-05 NOTE — Assessment & Plan Note (Signed)
Uncontrolled.  No red flags.  We will continue Zonegran 100 mg daily.  We will send in additional abortive therapy with diclofenac 25 mg daily.  Advised her to take with Imitrex and 25 mg of Benadryl as early in her course of migrain as possible.

## 2019-09-01 ENCOUNTER — Other Ambulatory Visit: Payer: Self-pay | Admitting: Family Medicine

## 2019-09-14 ENCOUNTER — Other Ambulatory Visit: Payer: Self-pay | Admitting: Obstetrics and Gynecology

## 2019-09-20 ENCOUNTER — Other Ambulatory Visit: Payer: Self-pay | Admitting: Family Medicine

## 2019-09-28 ENCOUNTER — Other Ambulatory Visit: Payer: Self-pay | Admitting: Family Medicine

## 2019-10-09 ENCOUNTER — Other Ambulatory Visit: Payer: Self-pay

## 2019-10-10 ENCOUNTER — Ambulatory Visit: Payer: BC Managed Care – PPO | Admitting: Obstetrics and Gynecology

## 2019-10-10 ENCOUNTER — Encounter: Payer: Self-pay | Admitting: Obstetrics and Gynecology

## 2019-10-10 VITALS — BP 110/68 | HR 87 | Temp 97.5°F | Ht 69.0 in | Wt 170.0 lb

## 2019-10-10 DIAGNOSIS — N951 Menopausal and female climacteric states: Secondary | ICD-10-CM

## 2019-10-10 MED ORDER — ESTRADIOL 0.05 MG/24HR TD PTTW: 1.0000 | MEDICATED_PATCH | TRANSDERMAL | 1 refills | Status: DC

## 2019-10-10 MED ORDER — PROGESTERONE MICRONIZED 100 MG PO CAPS: 100.0000 mg | ORAL_CAPSULE | Freq: Every day | ORAL | 1 refills | Status: DC

## 2019-10-10 NOTE — Progress Notes (Signed)
GYNECOLOGY  VISIT   HPI: 49 y.o.   Married White or Caucasian Not Hispanic or Latino  female   [phone] with No LMP recorded. Patient has had an ablation.   here for gabapentin recheck. Patients stats that she is doing well on her gabapentin and that is helps a lot. H/O endometrial ablation,no cycles. She was seen for her annual exam in 10/20 and was c/o severe vasomotor symptoms. She was started on gabapentin she was taking 200 mg a night which helped, but she was feeling dependent on it so stopped it.  She restarted it ~6 weeks ago for her vasomotor symptoms and sleep issues. It is helping her sleep at night. Night sweats are worse than the hot flashes secondary to sleep. It is all pretty miserable. Doesn't feel she could tolerate the gabapentin during the day.  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 Surgery was recommended, but she can't afford to take the time off for surgery and PT. She will eventually need hip replacement. She is in chronic pain which also makes sleep challenging.   H/O superficial thrombophlebitis, just after a pregnancy. No DVT  GYNECOLOGIC HISTORY: No LMP recorded. Patient has had an ablation. Contraception:tubal Menopausal hormone therapy: gabapentin        OB History    Gravida  3   Para  3   Term  3   Preterm      AB      Living  3     SAB      TAB      Ectopic      Multiple      Live Births  3              Patient Active Problem List   Diagnosis Date Noted  . Dyslipidemia 07/15/2019  . Left hip pain 07/10/2019  . Constipation 09/03/2018  . Varicose veins of both lower extremities with pain 04/20/2018  . Migraines 04/13/2018  . Hypothyroidism 04/13/2018  . Major depression in remission ([place]) 04/13/2018  . History of asthma 04/13/2018    Past Medical History:  Diagnosis Date  . Amenorrhea   . Arthritis   . Asthma   . Colon polyps   . Depression   . Frequent headaches   . Frequent  refractory urinary tract infections   . Hay fever   . History of chicken pox   . Superficial thrombophlebitis    Post partum  . Thyroid disease     Past Surgical History:  Procedure Laterality Date  . ADENOIDECTOMY  1975  . BREAST BIOPSY  1995  . CERVICAL DISCECTOMY  2003  . CHOLECYSTECTOMY  2013  . COLPOSCOPY    . ENDOMETRIAL ABLATION    . THYROIDECTOMY  2018  . TUBAL LIGATION      Current Outpatient Medications  Medication Sig Dispense Refill  . albuterol (VENTOLIN HFA) 108 (90 Base) MCG/ACT inhaler Inhale 2 puffs into the lungs every 4 (four) hours as needed for shortness of breath. 8 g 2  . albuterol (VENTOLIN HFA) 108 (90 Base) MCG/ACT inhaler Inhale 1-2 puffs into the lungs every 6 (six) hours as needed for wheezing or shortness of breath. 18 g 0  . ARMOUR THYROID 120 MG tablet Take 120 mg by mouth daily.  0  . buPROPion (WELLBUTRIN XL) 300 MG 24 hr tablet TAKE 1 TABLET BY MOUTH EVERY DAY 90 tablet 0  . cyclobenzaprine (FLEXERIL) 10 MG tablet Take  1 tablet (10 mg total) by mouth 3 (three) times daily as needed for muscle spasms. 30 tablet 1  . diclofenac (VOLTAREN) 75 MG EC tablet TAKE 1 TABLET BY MOUTH DAILY AS NEEDED. TAKE AS NEEDED WITH IMITREX AND 25MG BENADRYL FOR MIGRAINES. 30 tablet 0  . liothyronine (CYTOMEL) 5 MCG tablet Take 5 mcg by mouth 2 (two) times daily.    Marland Kitchen lubiprostone (AMITIZA) 24 MCG capsule Take 1 capsule (24 mcg total) by mouth 2 (two) times daily with a meal. 60 capsule 5  . Multiple Vitamin (MULTIVITAMIN) tablet Take 1 tablet by mouth daily.    . SUMAtriptan (IMITREX) 100 MG tablet Take 1 tablet (100 mg total) by mouth daily as needed. 10 tablet 0  . zonisamide (ZONEGRAN) 100 MG capsule Take 1 capsule (100 mg total) by mouth daily. 30 capsule 3   No current facility-administered medications for this visit.     ALLERGIES: Gluten meal, Oseltamivir, Other, Tamiflu [oseltamivir phosphate], and Zofran [ondansetron]  Family History  Problem Relation  Age of Onset  . Depression Mother   . Pancreatitis Mother   . Alcohol abuse Father   . Depression Father   . Diabetes Father   . Early death Father   . Heart attack Father   . Heart disease Father   . Pancreatitis Father   . Depression Sister   . Hyperlipidemia Sister   . Mental illness Sister   . Mental illness Daughter   . Migraines Daughter   . Depression Maternal Grandmother   . Early death Maternal Grandmother   . Hyperlipidemia Maternal Grandmother   . Hypertension Maternal Grandmother   . Stroke Maternal Grandmother   . Learning disabilities Maternal Grandmother   . Depression Maternal Grandfather   . Early death Maternal Grandfather   . Alcohol abuse Maternal Grandfather   . Learning disabilities Maternal Grandfather   . Epilepsy Maternal Grandfather   . Suicidality Maternal Grandfather   . Arthritis Paternal Grandmother   . Cancer Paternal Grandmother   . Hearing loss Paternal Grandmother   . Alcohol abuse Paternal Grandfather   . Hearing loss Paternal Grandfather   . Heart disease Paternal Grandfather   . Depression Sister   . Depression Sister     Social History   Socioeconomic History  . Marital status: Married    Spouse name: Not on file  . Number of children: Not on file  . Years of education: Not on file  . Highest education level: Not on file  Occupational History  . Not on file  Tobacco Use  . Smoking status: Never Smoker  . Smokeless tobacco: Never Used  Substance and Sexual Activity  . Alcohol use: Yes    Comment: 0-2 drinks a week  . Drug use: Never  . Sexual activity: Yes    Partners: Male    Birth control/protection: Surgical    Comment: tubal ligation  Other Topics Concern  . Not on file  Social History Narrative  . Not on file   Social Determinants of Health   Financial Resource Strain:   . Difficulty of Paying Living Expenses:   Food Insecurity:   . Worried About Charity fundraiser in the Last Year:   . Arboriculturist in  the Last Year:   Transportation Needs:   . Film/video editor (Medical):   Marland Kitchen Lack of Transportation (Non-Medical):   Physical Activity:   . Days of Exercise per Week:   . Minutes of Exercise per Session:  Stress:   . Feeling of Stress :   Social Connections:   . Frequency of Communication with Friends and Family:   . Frequency of Social Gatherings with Friends and Family:   . Attends Religious Services:   . Active Member of Clubs or Organizations:   . Attends Archivist Meetings:   Marland Kitchen Marital Status:   Intimate Partner Violence:   . Fear of Current or Ex-Partner:   . Emotionally Abused:   Marland Kitchen Physically Abused:   . Sexually Abused:     Review of Systems  All other systems reviewed and are negative.   PHYSICAL EXAMINATION:    BP 110/68   Pulse 87   Temp (!) 97.5 F (36.4 C)   Ht 5' 9"  (1.753 m)   Wt 170 lb (77.1 kg)   SpO2 99%   BMI 25.10 kg/m     General appearance: alert, cooperative and appears stated age   ASSESSMENT Severe vasomotor symptoms, gabapentin helps at night, can't take during the day. She is interested in HRT, discussed risks.     PLAN Will start HRT, discussed risks F/U in one month   An After Visit Summary was printed and given to the patient.  ~20 minutes spent in total patient care

## 2019-10-10 NOTE — Patient Instructions (Signed)
Menopause and Hormone Replacement Therapy Menopause is a normal time of life when menstrual periods stop completely and the ovaries stop producing the female hormones estrogen and progesterone. This lack of hormones can affect your health and cause undesirable symptoms. Hormone replacement therapy (HRT) can relieve some of those symptoms. What is hormone replacement therapy? HRT is the use of artificial (synthetic) hormones to replace hormones that your body has stopped producing because you have reached menopause. What are my options for HRT?  HRT may consist of the synthetic hormones estrogen and progestin, or it may consist of only estrogen (estrogen-only therapy). You and your health care provider will decide which form of HRT is best for you. If you choose to be on HRT and you have a uterus, estrogen and progestin are usually prescribed. Estrogen-only therapy is used for women who do not have a uterus. Possible options for taking HRT include:  Pills.  Patches.  Gels.  Sprays.  Vaginal cream.  Vaginal rings.  Vaginal inserts. The amount of hormone(s) that you take and how long you take the hormone(s) varies according to your health. It is important to:  Begin HRT with the lowest possible dosage.  Stop HRT as soon as your health care provider tells you to stop.  Work with your health care provider so that you feel informed and comfortable with your decisions. What are the benefits of HRT? HRT can reduce the frequency and severity of menopausal symptoms. Benefits of HRT vary according to the kind of symptoms that you have, how severe they are, and your overall health. HRT may help to improve the following symptoms of menopause:  Hot flashes and night sweats. These are sudden feelings of heat that spread over the face and body. The skin may turn red, like a blush. Night sweats are hot flashes that happen while you are sleeping or trying to sleep.  Bone loss (osteoporosis). The  body loses calcium more quickly after menopause, causing the bones to become weaker. This can increase the risk for bone breaks (fractures).  Vaginal dryness. The lining of the vagina can become thin and dry, which can cause pain during sex or cause infection, burning, or itching.  Urinary tract infections.  Urinary incontinence. This is the inability to control when you pass urine.  Irritability.  Short-term memory problems. What are the risks of HRT? Risks of HRT vary depending on your individual health and medical history. Risks of HRT also depend on whether you receive both estrogen and progestin or you receive estrogen only. HRT may increase the risk of:  Spotting. This is when a small amount of blood leaks from the vagina unexpectedly.  Endometrial cancer. This cancer is in the lining of the uterus (endometrium).  Breast cancer.  Increased density of breast tissue. This can make it harder to find breast cancer on a breast X-ray (mammogram).  Stroke.  Heart disease.  Blood clots.  Gallbladder disease.  Liver disease. Risks of HRT can increase if you have any of the following conditions:  Endometrial cancer.  Liver disease.  Heart disease.  Breast cancer.  History of blood clots.  History of stroke. Follow these instructions at home:  Take over-the-counter and prescription medicines only as told by your health care provider.  Get mammograms, pelvic exams, and medical checkups as often as told by your health care provider.  Have Pap tests done as often as told by your health care provider. A Pap test is sometimes called a Pap smear. It   is a screening test that is used to check for signs of cancer of the cervix and vagina. A Pap test can also identify the presence of infection or precancerous changes. Pap tests may be done: ? Every 3 years, starting at age 21. ? Every 5 years, starting after age 30, in combination with testing for human papillomavirus  (HPV). ? More often or less often depending on other medical conditions you have, your age, and other risk factors.  It is up to you to get the results of your Pap test. Ask your health care provider, or the department that is doing the test, when your results will be ready.  Keep all follow-up visits as told by your health care provider. This is important. Contact a health care provider if you have:  Pain or swelling in your legs.  Shortness of breath.  Chest pain.  Lumps or changes in your breasts or armpits.  Slurred speech.  Pain, burning, or bleeding when you urinate.  Unusual vaginal bleeding.  Dizziness or headaches.  Weakness or numbness in any part of your arms or legs.  Pain in your abdomen. Summary  Menopause is a normal time of life when menstrual periods stop completely and the ovaries stop producing the female hormones estrogen and progesterone.  Hormone replacement therapy (HRT) can relieve some of the symptoms of menopause.  HRT can reduce the frequency and severity of menopausal symptoms.  Risks of HRT vary depending on your individual health and medical history. This information is not intended to replace advice given to you by your health care provider. Make sure you discuss any questions you have with your health care provider. Document Revised: 02/27/2018 Document Reviewed: 02/27/2018 Elsevier Patient Education  2020 [employer] Inc[employer]  

## 2019-11-01 ENCOUNTER — Other Ambulatory Visit: Payer: Self-pay | Admitting: Family Medicine

## 2019-11-02 ENCOUNTER — Other Ambulatory Visit: Payer: Self-pay | Admitting: Obstetrics and Gynecology

## 2019-11-04 NOTE — Telephone Encounter (Signed)
Medication refill request: prometrium  Last AEX:  04/22/2019 Next AEX: Not scheduled but has a follow up appt on 11/13/2019 with JJ Last MMG (if hormonal medication request): "Summer 2019 in [place], [place] per patient" Refill authorized: #30, 0RF

## 2019-11-05 NOTE — Telephone Encounter (Signed)
She has a one month refill of both the estradiol and prometrium and has an appointment next week. Refill denied

## 2019-11-13 ENCOUNTER — Telehealth (INDEPENDENT_AMBULATORY_CARE_PROVIDER_SITE_OTHER): Payer: BC Managed Care – PPO | Admitting: Obstetrics and Gynecology

## 2019-11-13 ENCOUNTER — Other Ambulatory Visit: Payer: Self-pay

## 2019-11-19 NOTE — Progress Notes (Signed)
No show

## 2019-11-25 ENCOUNTER — Encounter: Payer: Self-pay | Admitting: Emergency Medicine

## 2019-11-25 ENCOUNTER — Other Ambulatory Visit: Payer: Self-pay

## 2019-11-25 ENCOUNTER — Emergency Department
Admission: EM | Admit: 2019-11-25 | Discharge: 2019-11-25 | Disposition: A | Discharge status: Home / Self Care | Payer: BC Managed Care – PPO | Attending: Emergency Medicine | Admitting: Emergency Medicine

## 2019-11-25 DIAGNOSIS — Z5321 Procedure and treatment not carried out due to patient leaving prior to being seen by health care provider: Secondary | ICD-10-CM | POA: Insufficient documentation

## 2019-11-25 DIAGNOSIS — R112 Nausea with vomiting, unspecified: Secondary | ICD-10-CM | POA: Diagnosis not present

## 2019-11-25 DIAGNOSIS — R1013 Epigastric pain: Secondary | ICD-10-CM | POA: Insufficient documentation

## 2019-11-25 DIAGNOSIS — M7918 Myalgia, other site: Secondary | ICD-10-CM | POA: Insufficient documentation

## 2019-11-25 LAB — TROPONIN I (HIGH SENSITIVITY)
Troponin I (High Sensitivity): <2 ng/L (ref <18)
Troponin I (High Sensitivity): <2 ng/L (ref <18)

## 2019-11-25 LAB — COMPREHENSIVE METABOLIC PANEL
ALT: 22 U/L (ref 0–44)
AST: 19 U/L (ref 15–41)
Albumin: 4.2 g/dL (ref 3.5–5.0)
Alkaline Phosphatase: 73 U/L (ref 38–126)
Anion gap: 8 (ref 5–15)
BUN: 16 mg/dL (ref 6–20)
CO2: 21 mmol/L — ABNORMAL LOW (ref 22–32)
Calcium: 8.8 mg/dL — ABNORMAL LOW (ref 8.9–10.3)
Chloride: 109 mmol/L (ref 98–111)
Creatinine, Ser: 0.76 mg/dL (ref 0.44–1.00)
GFR calc Af Amer: >60 mL/min (ref >60)
GFR calc non Af Amer: >60 mL/min (ref >60)
Glucose, Bld: 98 mg/dL (ref 70–99)
Potassium: 3.7 mmol/L (ref 3.5–5.1)
Sodium: 138 mmol/L (ref 135–145)
Total Bilirubin: 0.9 mg/dL (ref 0.3–1.2)
Total Protein: 7.1 g/dL (ref 6.5–8.1)

## 2019-11-25 LAB — URINALYSIS, COMPLETE (UACMP) WITH MICROSCOPIC
Bilirubin Urine: NEGATIVE
Glucose, UA: NEGATIVE mg/dL
Ketones, ur: 20 mg/dL — AB
Leukocytes,Ua: NEGATIVE
Nitrite: NEGATIVE
Protein, ur: NEGATIVE mg/dL
Specific Gravity, Urine: 1.024 (ref 1.005–1.030)
pH: 5 (ref 5.0–8.0)

## 2019-11-25 LAB — CBC
HCT: 49.7 % — ABNORMAL HIGH (ref 36.0–46.0)
Hemoglobin: 16.7 g/dL — ABNORMAL HIGH (ref 12.0–15.0)
MCH: 30.2 pg (ref 26.0–34.0)
MCHC: 33.6 g/dL (ref 30.0–36.0)
MCV: 89.9 fL (ref 80.0–100.0)
Platelets: 268 10*3/uL (ref 150–400)
RBC: 5.53 MIL/uL — ABNORMAL HIGH (ref 3.87–5.11)
RDW: 11.6 % (ref 11.5–15.5)
WBC: 4.5 10*3/uL (ref 4.0–10.5)
nRBC: 0 % (ref 0.0–0.2)

## 2019-11-25 LAB — LIPASE, BLOOD: Lipase: 24 U/L (ref 11–51)

## 2019-11-25 MED ORDER — SODIUM CHLORIDE 0.9% FLUSH: 3.0000 mL | Freq: Once | INTRAVENOUS | Status: DC

## 2019-11-25 NOTE — ED Triage Notes (Signed)
States woke up Saturday with nausea and vomiting and body aches.  Symptoms continued over weekend and today pain is more epigastric and radiates down into abdomen.  Feeling fatigued.  No vomiting this afternoon.  Feels nauseated and low appetite.  AAOx3.  Skin warm and dry. NAD

## 2019-11-26 ENCOUNTER — Encounter: Payer: Self-pay | Admitting: Family Medicine

## 2019-11-26 ENCOUNTER — Other Ambulatory Visit: Payer: Self-pay

## 2019-11-26 ENCOUNTER — Telehealth: Payer: Self-pay | Admitting: Family Medicine

## 2019-11-26 ENCOUNTER — Ambulatory Visit: Payer: BC Managed Care – PPO | Admitting: Family Medicine

## 2019-11-26 VITALS — BP 115/79 | HR 95 | Temp 97.5°F | Ht 69.0 in | Wt 163.8 lb

## 2019-11-26 DIAGNOSIS — R1084 Generalized abdominal pain: Secondary | ICD-10-CM | POA: Diagnosis not present

## 2019-11-26 LAB — POCT URINALYSIS DIPSTICK
Bilirubin, UA: POSITIVE
Blood, UA: NEGATIVE
Glucose, UA: NEGATIVE
Ketones, UA: NEGATIVE
Nitrite, UA: NEGATIVE
Protein, UA: POSITIVE — AB
Spec Grav, UA: 1.025 (ref 1.010–1.025)
Urobilinogen, UA: 1 E.U./dL
pH, UA: 6 (ref 5.0–8.0)

## 2019-11-26 LAB — POCT URINE PREGNANCY: Preg Test, Ur: NEGATIVE

## 2019-11-26 MED ORDER — PROMETHAZINE HCL 12.5 MG PO TABS: 12.5000 mg | ORAL_TABLET | Freq: Three times a day (TID) | ORAL | 0 refills | Status: DC | PRN

## 2019-11-26 NOTE — Telephone Encounter (Signed)
Noted  

## 2019-11-26 NOTE — Progress Notes (Signed)
   Carrie [name] is a 49 y.o. female who presents today for an office visit.  Assessment/Plan:  New/Acute Problems: Gastroenteritis No red flags.  Benign abdominal exam.  Labs from the ED reassuring.  Repeat UA today without blood.  Will treat symptomatically.  Will start Phenergan as she has not tolerated Zofran in the past due to allergy.  Encouraged good oral hydration.  Recommended Pepto-Bismol 3-4 times daily as well.  Can use Tylenol as needed for muscular pain.  If not improving the next 2 days or if symptoms worsen will need CT scan.  Discussed reasons to return to care.  Follow-up as needed.     Subjective:  HPI:  Patient with abdominal pain, diarrhea, and vomiting for the past 3 days.  Went to the emergency room.  Left before being seen.  Labs notable for hematuria.  Lipase and troponin were negative.  No fevers. Hard to keep down fluids. Symptoms improving modestly. Still with diarrhea. No more vomiting. No known sick contacts.        Objective:  Physical Exam: BP 115/79 (BP Location: Left Arm, Patient Position: Sitting, Cuff Size: Normal)   Pulse 95   Temp (!) 97.5 F (36.4 C) (Temporal)   Ht 5' 9"  (1.753 m)   Wt 163 lb 12.8 oz (74.3 kg)   SpO2 100%   BMI 24.19 kg/m   Gen: No acute distress, resting comfortably CV: Regular rate and rhythm with no murmurs appreciated Pulm: Normal work of breathing, clear to auscultation bilaterally with no crackles, wheezes, or rhonchi GI: Bowel sounds present.  Soft, nontender, nondistended.  No CVA tenderness. Neuro: Grossly normal, moves all extremities Psych: Normal affect and thought content      Carrie [name] M. [name], MD 11/26/2019 11:45 AM

## 2019-11-26 NOTE — Telephone Encounter (Signed)
Patient did go to the ED   Chief Complaint CHEST PAIN (>=21 years) - pain, pressure, heaviness or tightness Reason for Call Symptomatic / Request for Health Information Initial Comment Caller states she is having nausea, pain under breast bone and sometimes radiates to her chest, vomiting, fatigue and body aches. She just took a Rapid Covid-19 test and it was negative. Translation No Nurse Assessment Nurse: Waymond Cera, RN, Benjamine Mola Date/Time (Eastern Time): 11/25/2019 5:25:37 PM Confirm and document reason for call. If symptomatic, describe symptoms. ---Caller states states pain pain at bra line that goes up into chest. fatigue,nausea and vomiting. Has the patient had close contact with a person known or suspected to have the novel coronavirus illness OR traveled / lives in area with major community spread (including international travel) in the last 14 days from the onset of symptoms? * If Asymptomatic, screen for exposure and travel within the last 14 days. ---No Does the patient have any new or worsening symptoms? ---Yes Will a triage be completed? ---Yes Related visit to physician within the last 2 weeks? ---No Does the PT have any chronic conditions? (i.e. diabetes, asthma, this includes High risk factors for pregnancy, etc.) ---Yes List chronic conditions. ---hypothyroidism migraines Is the patient pregnant or possibly pregnant? (Ask all females between the ages of 73-55) ---No Is this a behavioral health or substance abuse call? ---NoPLEASE NOTE: All timestamps contained within this report are represented as [place] Standard Time. CONFIDENTIALTY NOTICE: This fax transmission is intended only for the addressee. It contains information that is legally privileged, confidential or otherwise protected from use or disclosure. If you are not the intended recipient, you are strictly prohibited from reviewing, disclosing, copying using or disseminating any of this information or taking  any action in reliance on or regarding this information. If you have received this fax in error, please notify us immediately by telephone so that we can arrange for its return to [place]. Phone: [phone], Toll-Free: [phone], Fax: [phone] Page: 2 of 2 Call Id: [id number] Guidelines Guideline Title Affirmed Question Affirmed Notes Nurse Date/Time Eilene Ghazi Time) Chest Pain [1] Chest pain lasts > 5 minutes AND [2] age > 43 Cantrell, RN, Benjamine Mola 11/25/2019 5:27:33 PM Disp. Time Eilene Ghazi Time) Disposition Final User 11/25/2019 5:23:59 PM Send to Urgent Emeterio Reeve 11/25/2019 5:29:42 PM 911 Outcome Documentation Cantrell, RN, Benjamine Mola Reason: States going by car. Verbalizes understanding of reason for 911. 11/25/2019 5:28:56 PM Call EMS 911 Now Yes Cantrell, RN, Lorin Glass Disagree/Comply Comply Caller Understands Yes PreDisposition InappropriateToAsk Care Advice Given Per Guideline CALL EMS 911 NOW: * Immediate medical attention is needed. You need to hang up and call 911 (or an ambulance). CARE ADVICE given per Chest Pain (Adult) guideline.

## 2019-11-26 NOTE — Patient Instructions (Signed)
It was very nice to see you today!  You have a viral GI infection.  All of the blood work and urine sample you got in the emergency room were reassuring.  Please start the Phenergan.  Please take Pepto-Bismol 3-4 times daily.  Please make sure that you are getting plenty of fluids.  Letter know if your symptoms are not improving in the next couple of days.  Take care, Dr [name]  Please try these tips to maintain a healthy lifestyle:   Eat at least 3 REAL meals and 1-2 snacks per day.  Aim for no more than 5 hours between eating.  If you eat breakfast, please do so within one hour of getting up.    Each meal should contain half fruits/vegetables, one quarter protein, and one quarter carbs (no bigger than a computer mouse)   Cut down on sweet beverages. This includes juice, soda, and sweet tea.     Drink at least 1 glass of water with each meal and aim for at least 8 glasses per day   Exercise at least 150 minutes every week.

## 2019-11-29 ENCOUNTER — Other Ambulatory Visit: Payer: Self-pay | Admitting: Obstetrics and Gynecology

## 2019-11-29 NOTE — Telephone Encounter (Signed)
Medication refill request: [email or web address]  Last AEX:  04-22-2019 JJ  Next OV: 12-19-19 Last MMG (if hormonal medication request): 2019 in [place] normal per patient  Refill authorized: Today, please advise.   Medication pended for #8, 0RF. Please refill if appropriate.

## 2019-12-05 ENCOUNTER — Other Ambulatory Visit: Payer: Self-pay

## 2019-12-05 MED ORDER — PROGESTERONE MICRONIZED 100 MG PO CAPS: 100.0000 mg | ORAL_CAPSULE | Freq: Every day | ORAL | 0 refills | Status: DC

## 2019-12-05 NOTE — Telephone Encounter (Signed)
Medication refill request: Progesterone  Last OV:  10/10/19 JJ Next OV: 12/19/19 Last MMG (if hormonal medication request): Summer 2019 in [place], [place] per patient  Refill authorized: Please advise; order pended #30 w/0 refills if authorized

## 2019-12-12 ENCOUNTER — Other Ambulatory Visit: Payer: Self-pay | Admitting: Family Medicine

## 2019-12-18 ENCOUNTER — Encounter: Payer: Self-pay | Admitting: Family Medicine

## 2019-12-19 ENCOUNTER — Telehealth: Payer: BC Managed Care – PPO | Admitting: Obstetrics and Gynecology

## 2019-12-19 NOTE — Progress Notes (Deleted)
Virtual Visit via Video Note  I connected with Carrie [name] on 12/19/19 at  2:30 PM EDT by a video enabled telemedicine application and verified that I am speaking with the correct person using two identifiers.  Location: Patient: *** Provider: ***   I discussed the limitations of evaluation and management by telemedicine and the availability of in person appointments. The patient expressed understanding and agreed to proceed.  GYNECOLOGY  VISIT   HPI: 49 y.o.   Married White or Caucasian Not Hispanic or Latino  female   [phone] with No LMP recorded. Patient has had an ablation.   here for a virtual visit to f/u on HRT. She started on HRT 2 months ago.   She is on the vivelle dot 0.05 mg and prometrium 100 mg daily  GYNECOLOGIC HISTORY: No LMP recorded. Patient has had an ablation. Contraception:*** Menopausal hormone therapy: ***        OB History    Gravida  3   Para  3   Term  3   Preterm      AB      Living  3     SAB      TAB      Ectopic      Multiple      Live Births  3              Patient Active Problem List   Diagnosis Date Noted  . Dyslipidemia 07/15/2019  . Left hip pain 07/10/2019  . Constipation 09/03/2018  . Varicose veins of both lower extremities with pain 04/20/2018  . Migraines 04/13/2018  . Hypothyroidism 04/13/2018  . Major depression in remission ([place]) 04/13/2018  . History of asthma 04/13/2018    Past Medical History:  Diagnosis Date  . Amenorrhea   . Arthritis   . Asthma   . Colon polyps   . Depression   . Frequent headaches   . Frequent refractory urinary tract infections   . Hay fever   . History of chicken pox   . Superficial thrombophlebitis    Post partum  . Thyroid disease     Past Surgical History:  Procedure Laterality Date  . ADENOIDECTOMY  1975  . BREAST BIOPSY  1995  . CERVICAL DISCECTOMY  2003  . CHOLECYSTECTOMY  2013  . COLPOSCOPY    . ENDOMETRIAL ABLATION    . THYROIDECTOMY  2018  . TUBAL  LIGATION      Current Outpatient Medications  Medication Sig Dispense Refill  . albuterol (VENTOLIN HFA) 108 (90 Base) MCG/ACT inhaler Inhale 2 puffs into the lungs every 4 (four) hours as needed for shortness of breath. 8 g 2  . ARMOUR THYROID 120 MG tablet Take 120 mg by mouth daily.  0  . buPROPion (WELLBUTRIN XL) 300 MG 24 hr tablet TAKE 1 TABLET BY MOUTH EVERY DAY 90 tablet 0  . cyclobenzaprine (FLEXERIL) 10 MG tablet Take 1 tablet (10 mg total) by mouth 3 (three) times daily as needed for muscle spasms. 30 tablet 1  . estradiol (VIVELLE-DOT) 0.05 MG/24HR patch PLACE 1 PATCH (0.05 MG TOTAL) ONTO THE SKIN 2 (TWO) TIMES A WEEK. 24 patch 0  . liothyronine (CYTOMEL) 5 MCG tablet Take 5 mcg by mouth 2 (two) times daily.    Marland Kitchen lubiprostone (AMITIZA) 24 MCG capsule Take 1 capsule (24 mcg total) by mouth 2 (two) times daily with a meal. (Patient not taking: Reported on 11/26/2019) 60 capsule 5  . Multiple Vitamin (MULTIVITAMIN) tablet  Take 1 tablet by mouth daily.    . progesterone (PROMETRIUM) 100 MG capsule Take 1 capsule (100 mg total) by mouth daily. 30 capsule 1  . progesterone (PROMETRIUM) 100 MG capsule Take 1 capsule (100 mg total) by mouth daily. 30 capsule 0  . promethazine (PHENERGAN) 12.5 MG tablet Take 1 tablet (12.5 mg total) by mouth every 8 (eight) hours as needed for nausea or vomiting. 20 tablet 0  . SUMAtriptan (IMITREX) 100 MG tablet Take 1 tablet (100 mg total) by mouth daily as needed. 10 tablet 0  . zonisamide (ZONEGRAN) 100 MG capsule TAKE 1 CAPSULE BY MOUTH EVERY DAY 90 capsule 1   No current facility-administered medications for this visit.     ALLERGIES: Gluten meal, Oseltamivir, Other, Tamiflu [oseltamivir phosphate], and Zofran [ondansetron]  Family History  Problem Relation Age of Onset  . Depression Mother   . Pancreatitis Mother   . Alcohol abuse Father   . Depression Father   . Diabetes Father   . Early death Father   . Heart attack Father   . Heart  disease Father   . Pancreatitis Father   . Depression Sister   . Hyperlipidemia Sister   . Mental illness Sister   . Mental illness Daughter   . Migraines Daughter   . Depression Maternal Grandmother   . Early death Maternal Grandmother   . Hyperlipidemia Maternal Grandmother   . Hypertension Maternal Grandmother   . Stroke Maternal Grandmother   . Learning disabilities Maternal Grandmother   . Depression Maternal Grandfather   . Early death Maternal Grandfather   . Alcohol abuse Maternal Grandfather   . Learning disabilities Maternal Grandfather   . Epilepsy Maternal Grandfather   . Suicidality Maternal Grandfather   . Arthritis Paternal Grandmother   . Cancer Paternal Grandmother   . Hearing loss Paternal Grandmother   . Alcohol abuse Paternal Grandfather   . Hearing loss Paternal Grandfather   . Heart disease Paternal Grandfather   . Depression Sister   . Depression Sister     Social History   Socioeconomic History  . Marital status: Married    Spouse name: Not on file  . Number of children: Not on file  . Years of education: Not on file  . Highest education level: Not on file  Occupational History  . Not on file  Tobacco Use  . Smoking status: Never Smoker  . Smokeless tobacco: Never Used  Vaping Use  . Vaping Use: Never used  Substance and Sexual Activity  . Alcohol use: Yes    Comment: 0-2 drinks a week  . Drug use: Never  . Sexual activity: Yes    Partners: Male    Birth control/protection: Surgical    Comment: tubal ligation  Other Topics Concern  . Not on file  Social History Narrative  . Not on file   Social Determinants of Health   Financial Resource Strain:   . Difficulty of Paying Living Expenses:   Food Insecurity:   . Worried About Charity fundraiser in the Last Year:   . Arboriculturist in the Last Year:   Transportation Needs:   . Film/video editor (Medical):   Marland Kitchen Lack of Transportation (Non-Medical):   Physical Activity:   .  Days of Exercise per Week:   . Minutes of Exercise per Session:   Stress:   . Feeling of Stress :   Social Connections:   . Frequency of Communication with Friends and Family:   .  Frequency of Social Gatherings with Friends and Family:   . Attends Religious Services:   . Active Member of Clubs or Organizations:   . Attends Archivist Meetings:   Marland Kitchen Marital Status:   Intimate Partner Violence:   . Fear of Current or Ex-Partner:   . Emotionally Abused:   Marland Kitchen Physically Abused:   . Sexually Abused:     ROS  PHYSICAL EXAMINATION:    There were no vitals taken for this visit.    General appearance: alert, cooperative and appears stated age Neck: no adenopathy, supple, symmetrical, trachea midline and thyroid {CHL AMB PHY EX THYROID NORM DEFAULT:[phone]::"normal to inspection and palpation"} Breasts: {Exam; breast:13139::"normal appearance, no masses or tenderness"} Abdomen: soft, non-tender; non distended, no masses,  no organomegaly  Pelvic: External genitalia:  no lesions              Urethra:  normal appearing urethra with no masses, tenderness or lesions              Bartholins and Skenes: normal                 Vagina: normal appearing vagina with normal color and discharge, no lesions              Cervix: {CHL AMB PHY EX CERVIX NORM DEFAULT:[phone]::"no lesions"}              Bimanual Exam:  Uterus:  {CHL AMB PHY EX UTERUS NORM DEFAULT:[id number]::"normal size, contour, position, consistency, mobility, non-tender"}              Adnexa: {CHL AMB PHY EX ADNEXA NO MASS DEFAULT:[phone]::"no mass, fullness, tenderness"}              Rectovaginal: {yes no:314532}.  Confirms.              Anus:  normal sphincter tone, no lesions  Chaperone was present for exam.  ASSESSMENT     PLAN    An After Visit Summary was printed and given to the patient.  *** minutes face to face time of which over 50% was spent in counseling.       I discussed the assessment and  treatment plan with the patient. The patient was provided an opportunity to ask questions and all were answered. The patient agreed with the plan and demonstrated an understanding of the instructions.   The patient was advised to call back or seek an in-person evaluation if the symptoms worsen or if the condition fails to improve as anticipated.  I provided *** minutes of non-face-to-face time during this encounter.   Salvadore Dom, MD

## 2019-12-28 ENCOUNTER — Encounter: Payer: Self-pay | Admitting: Obstetrics and Gynecology

## 2019-12-30 ENCOUNTER — Other Ambulatory Visit: Payer: Self-pay | Admitting: Obstetrics and Gynecology

## 2019-12-30 MED ORDER — PROGESTERONE MICRONIZED 100 MG PO CAPS: 100.0000 mg | ORAL_CAPSULE | Freq: Every day | ORAL | 0 refills | Status: DC

## 2019-12-30 NOTE — Telephone Encounter (Signed)
[name], Carrie [name]  P [facility] Hi Dr Talbert Nan. I apologize for missing my last appt with you. I have rescheduled but wasn't able to get another appointment until mid August. Can you send in a refill of the progesterone to get me through until then?

## 2019-12-30 NOTE — Telephone Encounter (Signed)
Refill for 90 days sent. Mychart message sent.

## 2019-12-30 NOTE — Telephone Encounter (Signed)
Medication refill request: Progesterone Last AEX:  04/22/19 JJ Next AEX: 02/20/20 Last MMG (if hormonal medication request): last charted MMG was Summer 2019 per patient in [place] and was normal Refill authorized: Please advise; order pended #30 w/1 refill if appropriate.

## 2020-01-01 ENCOUNTER — Encounter: Payer: Self-pay | Admitting: Family Medicine

## 2020-01-01 ENCOUNTER — Ambulatory Visit: Payer: BC Managed Care – PPO | Admitting: Family Medicine

## 2020-01-01 ENCOUNTER — Other Ambulatory Visit: Payer: Self-pay

## 2020-01-01 VITALS — BP 110/70 | HR 78 | Temp 97.7°F | Ht 69.0 in | Wt 165.0 lb

## 2020-01-01 DIAGNOSIS — E039 Hypothyroidism, unspecified: Secondary | ICD-10-CM | POA: Diagnosis not present

## 2020-01-01 DIAGNOSIS — E2839 Other primary ovarian failure: Secondary | ICD-10-CM

## 2020-01-01 DIAGNOSIS — M25552 Pain in left hip: Secondary | ICD-10-CM

## 2020-01-01 LAB — TSH: TSH: 0.06 u[IU]/mL — ABNORMAL LOW (ref 0.35–4.50)

## 2020-01-01 LAB — T4, FREE: Free T4: 0.68 ng/dL (ref 0.60–1.60)

## 2020-01-01 LAB — T3, FREE: T3, Free: 4.2 pg/mL (ref 2.3–4.2)

## 2020-01-01 MED ORDER — LIOTHYRONINE SODIUM 5 MCG PO TABS: 5.0000 ug | ORAL_TABLET | Freq: Two times a day (BID) | ORAL | 0 refills | Status: DC

## 2020-01-01 MED ORDER — ARMOUR THYROID 120 MG PO TABS: 120.0000 mg | ORAL_TABLET | Freq: Every day | ORAL | 0 refills | Status: DC

## 2020-01-01 NOTE — Progress Notes (Signed)
   Carrie [name] is a 49 y.o. female who presents today for an office visit.  Assessment/Plan:  Chronic Problems Addressed Today: Left hip pain Diagnosed with hip impingement syndrome.  She does not wish to have surgery at this point.  Will place referral to sports medicine for further evaluation/management.  Hypothyroidism We will check TSH, T3, and T4 today.  Will place referral to endocrinology for management of Armour Thyroid.  Will give bridge refill today until she establishes with endocrinology.    Subjective:  HPI:   Patient was diagnosed with hypothyroidism when she was in her 75s. Was initially started on synthroid which did not seem to be effective. She was started on armour thyroid 183m daily and cytomel 574m twice daily. She has been out of cytomel for the past several days.  Her previous physician who is prescribing her thyroid medications recently tired and she has been trying to find a new physician to prescribe medications however has not had much success.  She has not had thyroid levels checked in about a year.  Has been having left hip pain due to hip impingement. Has been following with orthopedics. Ended up seeing Dr StAretha Parrotho wanted to proceed with arthroscopic surgery. She was concerned about rehab processes following this and was not sure if it was worth it.  She was told that she would eventually need hip replacement in 5 to 10 years regardless.  She was also told arthroscopic surgery had about a 66% chance of success.  She received bilateral hip injections which helped significantly.       Objective:  Physical Exam: BP 110/70   Pulse 78   Temp 97.7 F (36.5 C) (Temporal)   Ht 5' 9"  (1.753 m)   Wt 165 lb (74.8 kg)   LMP 07/12/2011   SpO2 100%   BMI 24.37 kg/m   Gen: No acute distress, resting comfortably CV: Regular rate and rhythm with no murmurs appreciated Pulm: Normal work of breathing, clear to auscultation bilaterally with no crackles, wheezes,  or rhonchi Neuro: Grossly normal, moves all extremities Psych: Normal affect and thought content      Carrie [name] M. PaJerline PainMD 01/01/2020 9:59 AM

## 2020-01-01 NOTE — Assessment & Plan Note (Signed)
Diagnosed with hip impingement syndrome.  She does not wish to have surgery at this point.  Will place referral to sports medicine for further evaluation/management.

## 2020-01-01 NOTE — Patient Instructions (Signed)
It was very nice to see you today!  We will check blood work today.  I will refill your thyroid medication and place referral for you to see the endocrinologist to discuss managing your Armour Thyroid.  I will place referral for you to see sports medicine.  Take care, Dr [name]  Please try these tips to maintain a healthy lifestyle:   Eat at least 3 REAL meals and 1-2 snacks per day.  Aim for no more than 5 hours between eating.  If you eat breakfast, please do so within one hour of getting up.    Each meal should contain half fruits/vegetables, one quarter protein, and one quarter carbs (no bigger than a computer mouse)   Cut down on sweet beverages. This includes juice, soda, and sweet tea.     Drink at least 1 glass of water with each meal and aim for at least 8 glasses per day   Exercise at least 150 minutes every week.

## 2020-01-01 NOTE — Assessment & Plan Note (Signed)
We will check TSH, T3, and T4 today.  Will place referral to endocrinology for management of Armour Thyroid.  Will give bridge refill today until she establishes with endocrinology.

## 2020-01-02 NOTE — Progress Notes (Signed)
Please inform patient of the following:  She is getting slightly too much thyroid replacement. Recommend she follow up with endocrinology as we discussed at her OV.  [name]. [name], MD 01/02/2020 3:30 PM

## 2020-01-13 ENCOUNTER — Encounter: Payer: Self-pay | Admitting: Family Medicine

## 2020-01-17 NOTE — Telephone Encounter (Signed)
Called Dr. Altheimer's office. They had not received the referral. Resent referral and office notes via fax. Pt should receive a call within 2 business days.

## 2020-01-26 ENCOUNTER — Encounter: Payer: Self-pay | Admitting: Family Medicine

## 2020-02-03 MED ORDER — ARMOUR THYROID 120 MG PO TABS: 120.0000 mg | ORAL_TABLET | Freq: Every day | ORAL | 0 refills | Status: DC

## 2020-02-17 ENCOUNTER — Telehealth: Payer: Self-pay | Admitting: Obstetrics and Gynecology

## 2020-02-17 DIAGNOSIS — Z79890 Hormone replacement therapy (postmenopausal): Secondary | ICD-10-CM

## 2020-02-17 NOTE — Telephone Encounter (Signed)
Medication refill request: Estradiol 0.3m patch  Last AEX:  04/22/19 Next OV:  02/20/20 [place] (if hormonal medication request): [place]  Refill authorized: 24/0

## 2020-02-17 NOTE — Telephone Encounter (Signed)
I see that the patient has a video visit later this week. Please make sure she knows that she needs to keep up on her mammograms to stay on HRT. 1 month sent

## 2020-02-18 NOTE — Telephone Encounter (Signed)
Left message to call Sharee Pimple, RN at [facility].

## 2020-02-20 ENCOUNTER — Telehealth: Payer: BC Managed Care – PPO | Admitting: Obstetrics and Gynecology

## 2020-02-20 NOTE — Telephone Encounter (Signed)
Spoke with pt. Pt states doing well on HRT and only having 1-2 hot flashes now that are manageable and definitely decreased since starting HRT. Pt states has no other concerns. Pt states is traveling in RV right now and not sure if will have reception for video visit today with Dr Talbert Nan. Pt advised will give update to Dr Talbert Nan and will cancel appt today. Pt agreeable.  Pt aware of needed update MMG per Dr Talbert Nan. Pt states did have MMG last year and will send info to Mychart, aware we do not have on file. Pt aware needs updated MMG  in order to continue HRT. Pt verbalized understanding. Pt aware of sent Rx for Vivelle Dot patches for 1 month.   Routing to Dr Talbert Nan for review. Please advise on any further recommedations

## 2020-02-20 NOTE — Progress Notes (Unsigned)
Virtual Visit via Video Note  I connected with Carrie [name] on 02/20/20 at  4:00 PM EDT by a video enabled telemedicine application and verified that I am speaking with the correct person using two identifiers.  Location: Patient: *** Provider: ***   I discussed the limitations of evaluation and management by telemedicine and the availability of in person appointments. The patient expressed understanding and agreed to proceed.  GYNECOLOGY  VISIT   HPI: 49 y.o.   Married White or Caucasian Not Hispanic or Latino  female   [phone] with No LMP recorded. Patient has had an ablation.   here for follow up on HRT. She has a h/o an endometrial ablation and hasn't cycled in years. She was started on gabapentin for vasomotor symptoms last fall. The gabapentin helped at night, but she couldn't tolerate it during the day. In the spring she was changed to HRT for severe vasomotor symptoms. She is on the 0.05 mg vivelle dot and 100 mg of daily Prometrium.   GYNECOLOGIC HISTORY: No LMP recorded. Patient has had an ablation. Contraception: tubal ligation Menopausal hormone therapy: yes        OB History    Gravida  3   Para  3   Term  3   Preterm      AB      Living  3     SAB      TAB      Ectopic      Multiple      Live Births  3              Patient Active Problem List   Diagnosis Date Noted  . Dyslipidemia 07/15/2019  . Left hip pain 07/10/2019  . Constipation 09/03/2018  . Varicose veins of both lower extremities with pain 04/20/2018  . Migraines 04/13/2018  . Hypothyroidism 04/13/2018  . Major depression in remission ([place]) 04/13/2018  . History of asthma 04/13/2018    Past Medical History:  Diagnosis Date  . Amenorrhea   . Arthritis   . Asthma   . Colon polyps   . Depression   . Frequent headaches   . Frequent refractory urinary tract infections   . Hay fever   . History of chicken pox   . Superficial thrombophlebitis    Post partum  . Thyroid  disease     Past Surgical History:  Procedure Laterality Date  . ADENOIDECTOMY  1975  . BREAST BIOPSY  1995  . CERVICAL DISCECTOMY  2003  . CHOLECYSTECTOMY  2013  . COLPOSCOPY    . ENDOMETRIAL ABLATION    . THYROIDECTOMY  2018  . TUBAL LIGATION      Current Outpatient Medications  Medication Sig Dispense Refill  . albuterol (VENTOLIN HFA) 108 (90 Base) MCG/ACT inhaler Inhale 2 puffs into the lungs every 4 (four) hours as needed for shortness of breath. 8 g 2  . ARMOUR THYROID 120 MG tablet Take 1 tablet (120 mg total) by mouth daily. 90 tablet 0  . buPROPion (WELLBUTRIN XL) 300 MG 24 hr tablet TAKE 1 TABLET BY MOUTH EVERY DAY 90 tablet 0  . cyclobenzaprine (FLEXERIL) 10 MG tablet Take 1 tablet (10 mg total) by mouth 3 (three) times daily as needed for muscle spasms. 30 tablet 1  . estradiol (VIVELLE-DOT) 0.05 MG/24HR patch Place 1 patch (0.05 mg total) onto the skin 2 (two) times a week. 8 patch 0  . liothyronine (CYTOMEL) 5 MCG tablet Take 1 tablet (5 mcg  total) by mouth 2 (two) times daily. 180 tablet 0  . lubiprostone (AMITIZA) 24 MCG capsule Take 1 capsule (24 mcg total) by mouth 2 (two) times daily with a meal. (Patient not taking: Reported on 11/26/2019) 60 capsule 5  . Multiple Vitamin (MULTIVITAMIN) tablet Take 1 tablet by mouth daily.    . progesterone (PROMETRIUM) 100 MG capsule Take 1 capsule (100 mg total) by mouth daily. 90 capsule 0  . SUMAtriptan (IMITREX) 100 MG tablet Take 1 tablet (100 mg total) by mouth daily as needed. 10 tablet 0  . zonisamide (ZONEGRAN) 100 MG capsule TAKE 1 CAPSULE BY MOUTH EVERY DAY 90 capsule 1   No current facility-administered medications for this visit.     ALLERGIES: Gluten meal, Oseltamivir, Other, Tamiflu [oseltamivir phosphate], and Zofran [ondansetron]  Family History  Problem Relation Age of Onset  . Depression Mother   . Pancreatitis Mother   . Alcohol abuse Father   . Depression Father   . Diabetes Father   . Early death  Father   . Heart attack Father   . Heart disease Father   . Pancreatitis Father   . Depression Sister   . Hyperlipidemia Sister   . Mental illness Sister   . Mental illness Daughter   . Migraines Daughter   . Depression Maternal Grandmother   . Early death Maternal Grandmother   . Hyperlipidemia Maternal Grandmother   . Hypertension Maternal Grandmother   . Stroke Maternal Grandmother   . Learning disabilities Maternal Grandmother   . Depression Maternal Grandfather   . Early death Maternal Grandfather   . Alcohol abuse Maternal Grandfather   . Learning disabilities Maternal Grandfather   . Epilepsy Maternal Grandfather   . Suicidality Maternal Grandfather   . Arthritis Paternal Grandmother   . Cancer Paternal Grandmother   . Hearing loss Paternal Grandmother   . Alcohol abuse Paternal Grandfather   . Hearing loss Paternal Grandfather   . Heart disease Paternal Grandfather   . Depression Sister   . Depression Sister     Social History   Socioeconomic History  . Marital status: Married    Spouse name: Not on file  . Number of children: Not on file  . Years of education: Not on file  . Highest education level: Not on file  Occupational History  . Not on file  Tobacco Use  . Smoking status: Never Smoker  . Smokeless tobacco: Never Used  Vaping Use  . Vaping Use: Never used  Substance and Sexual Activity  . Alcohol use: Yes    Comment: 0-2 drinks a week  . Drug use: Never  . Sexual activity: Yes    Partners: Male    Birth control/protection: Surgical    Comment: tubal ligation  Other Topics Concern  . Not on file  Social History Narrative  . Not on file   Social Determinants of Health   Financial Resource Strain:   . Difficulty of Paying Living Expenses:   Food Insecurity:   . Worried About Charity fundraiser in the Last Year:   . Arboriculturist in the Last Year:   Transportation Needs:   . Film/video editor (Medical):   Marland Kitchen Lack of Transportation  (Non-Medical):   Physical Activity:   . Days of Exercise per Week:   . Minutes of Exercise per Session:   Stress:   . Feeling of Stress :   Social Connections:   . Frequency of Communication with Friends and Family:   .  Frequency of Social Gatherings with Friends and Family:   . Attends Religious Services:   . Active Member of Clubs or Organizations:   . Attends Archivist Meetings:   Marland Kitchen Marital Status:   Intimate Partner Violence:   . Fear of Current or Ex-Partner:   . Emotionally Abused:   Marland Kitchen Physically Abused:   . Sexually Abused:     ROS  PHYSICAL EXAMINATION:    There were no vitals taken for this visit.    General appearance: alert, cooperative and appears stated age Neck: no adenopathy, supple, symmetrical, trachea midline and thyroid {CHL AMB PHY EX THYROID NORM DEFAULT:[phone]::"normal to inspection and palpation"} Breasts: {Exam; breast:13139::"normal appearance, no masses or tenderness"} Abdomen: soft, non-tender; non distended, no masses,  no organomegaly  Pelvic: External genitalia:  no lesions              Urethra:  normal appearing urethra with no masses, tenderness or lesions              Bartholins and Skenes: normal                 Vagina: normal appearing vagina with normal color and discharge, no lesions              Cervix: {CHL AMB PHY EX CERVIX NORM DEFAULT:[phone]::"no lesions"}              Bimanual Exam:  Uterus:  {CHL AMB PHY EX UTERUS NORM [phone]::"normal size, contour, position, consistency, mobility, non-tender"}              Adnexa: {CHL AMB PHY EX ADNEXA NO MASS DEFAULT:[phone]::"no mass, fullness, tenderness"}              Rectovaginal: {yes no:314532}.  Confirms.              Anus:  normal sphincter tone, no lesions  Chaperone was present for exam.  ASSESSMENT On HRT for vasomotor symptoms    PLAN Needs mammogram      Salvadore Dom, MD

## 2020-02-20 NOTE — Telephone Encounter (Signed)
Patient returned call

## 2020-02-21 NOTE — Telephone Encounter (Signed)
I agree

## 2020-03-07 ENCOUNTER — Other Ambulatory Visit: Payer: Self-pay | Admitting: Family Medicine

## 2020-03-18 ENCOUNTER — Telehealth: Payer: Self-pay

## 2020-03-18 ENCOUNTER — Encounter: Payer: Self-pay | Admitting: Obstetrics and Gynecology

## 2020-03-18 ENCOUNTER — Other Ambulatory Visit: Payer: Self-pay | Admitting: Obstetrics and Gynecology

## 2020-03-18 DIAGNOSIS — Z79890 Hormone replacement therapy (postmenopausal): Secondary | ICD-10-CM

## 2020-03-18 NOTE — Telephone Encounter (Signed)
Pt sent following mychart message:   HRT Received: Today Scalf, Carrie [name] [facility] Hi Dr Talbert Nan. My pharmacy alerted me that you did not approve a refill of my estradiol patch. When I spoke with your nurse just before my August appt, she told me she would update my chart and that you had called in a refill already. She told me to go ahead and cancel my virtual appointment so I did not think I needed to reschedule as I could have continued on with the appointment that day. If I misunderstood, I'm sorry. Please let me know what I need to do.

## 2020-03-18 NOTE — Telephone Encounter (Signed)
Spoke with pt. Pt advised last refill for Vivelle Dot patches were for 1 month supply due to needing updated MMG. Pt states was confused.  Pt states not set up screening MMG at this time. Pt new to area. Pt lives in [place]. Pt given [facility] numbers to call and make appt. Pt agreeable and thankful for advice.   Will wait for MMG results for new refills. Pt verbalized understanding.

## 2020-03-19 ENCOUNTER — Other Ambulatory Visit: Payer: Self-pay | Admitting: Obstetrics and Gynecology

## 2020-03-19 DIAGNOSIS — Z1231 Encounter for screening mammogram for malignant neoplasm of breast: Secondary | ICD-10-CM

## 2020-03-20 NOTE — Telephone Encounter (Signed)
Pt has scheduled MMG on 04/02/20 at [facility].   Routing to Dr Talbert Nan for update and Rx refill after results.

## 2020-03-22 NOTE — Telephone Encounter (Signed)
Please refill her vivelle dot and prometrium for one more month to give her time to do her mammogram

## 2020-03-23 ENCOUNTER — Other Ambulatory Visit: Payer: Self-pay | Admitting: Obstetrics and Gynecology

## 2020-03-23 MED ORDER — PROGESTERONE MICRONIZED 100 MG PO CAPS: 100.0000 mg | ORAL_CAPSULE | Freq: Every day | ORAL | 0 refills | Status: DC

## 2020-03-23 MED ORDER — ESTRADIOL 0.05 MG/24HR TD PTTW: 1.0000 | MEDICATED_PATCH | TRANSDERMAL | 0 refills | Status: DC

## 2020-03-23 NOTE — Telephone Encounter (Signed)
Left pt detailed message per DPR. Pt given update from Dr Talbert Nan for refill of Rx Vivelle Dot and Prometrium.  Rx x 2 sent to [facility] pharmacy on file.  Pt to return call with any questions or concerns. Will wait for more refills after 04/02/20 MMG.  Encounter closed.

## 2020-03-23 NOTE — Telephone Encounter (Signed)
Duplicate

## 2020-04-17 ENCOUNTER — Other Ambulatory Visit: Payer: Self-pay | Admitting: Obstetrics and Gynecology

## 2020-04-17 DIAGNOSIS — Z79890 Hormone replacement therapy (postmenopausal): Secondary | ICD-10-CM

## 2020-04-21 ENCOUNTER — Other Ambulatory Visit: Payer: Self-pay

## 2020-04-21 ENCOUNTER — Ambulatory Visit
Admission: RE | Admit: 2020-04-21 | Discharge: 2020-04-21 | Disposition: A | Discharge status: Home / Self Care | Payer: BC Managed Care – PPO | Source: Ambulatory Visit | Attending: Obstetrics and Gynecology | Admitting: Obstetrics and Gynecology

## 2020-04-21 DIAGNOSIS — Z1231 Encounter for screening mammogram for malignant neoplasm of breast: Secondary | ICD-10-CM | POA: Diagnosis not present

## 2020-04-26 ENCOUNTER — Other Ambulatory Visit: Payer: Self-pay | Admitting: Family Medicine

## 2020-04-30 ENCOUNTER — Other Ambulatory Visit: Payer: Self-pay | Admitting: Family Medicine

## 2020-05-01 ENCOUNTER — Other Ambulatory Visit: Payer: Self-pay | Admitting: Family Medicine

## 2020-05-01 NOTE — Telephone Encounter (Signed)
LAST APPOINTMENT DATE: 04/30/2020  Ok to refills? See lab note

## 2020-05-01 NOTE — Telephone Encounter (Signed)
Will send in. Please check with her to see if she has scheduled with endocrinology yet.

## 2020-05-16 ENCOUNTER — Other Ambulatory Visit: Payer: Self-pay | Admitting: Obstetrics and Gynecology

## 2020-05-16 DIAGNOSIS — Z79890 Hormone replacement therapy (postmenopausal): Secondary | ICD-10-CM

## 2020-05-20 ENCOUNTER — Other Ambulatory Visit: Payer: Self-pay

## 2020-05-20 ENCOUNTER — Other Ambulatory Visit: Payer: Self-pay | Admitting: Obstetrics and Gynecology

## 2020-05-20 DIAGNOSIS — Z79890 Hormone replacement therapy (postmenopausal): Secondary | ICD-10-CM

## 2020-05-20 NOTE — Telephone Encounter (Signed)
Last AEX 04/2019, next not scheduled  Last MMG 04/21/20- Birads, 1 Neg   Left message for pt to return call to triage RN. Pt can schedule AEX with front office as well.

## 2020-05-20 NOTE — Telephone Encounter (Signed)
Pt sent following mychart message:  Guillot, Lynnell Jude, Francesca Jewett, MD 2 days ago  CR Hi Dr. Talbert Nan. I tried to get my Estradiol filled today and they said appt was required. Does that mean I need another appt with you? I had my mammogram at [facility] a few weeks ago.   [place], Lynnell Jude, Francesca Jewett, MD 2 months ago  CR Hi Dr Talbert Nan. My pharmacy alerted me that you did not approve a refill of my estradiol patch. When I spoke with your nurse just before my August appt, she told me she would update my chart and that you had called in a refill already. She told me to go ahead and cancel my virtual appointment so I did not think I needed to reschedule as I could have continued on with the appointment that day. If I misunderstood, I'm sorry. Please let me know what I need to do.

## 2020-05-21 MED ORDER — ESTRADIOL 0.05 MG/24HR TD PTTW: 1.0000 | MEDICATED_PATCH | TRANSDERMAL | 2 refills | Status: DC

## 2020-05-21 MED ORDER — PROGESTERONE MICRONIZED 100 MG PO CAPS: 100.0000 mg | ORAL_CAPSULE | Freq: Every day | ORAL | 2 refills | Status: DC

## 2020-05-21 NOTE — Telephone Encounter (Signed)
Spoke with pt. Pt requesting refills on Estradiol patch and Progesterone since having Screening MMG completed on 04/21/20- Birads 1, Neg. Pt states has this week left of current Rx.   Routing to Dr Talbert Nan, Rx x 2 pended if approved. Refills until AEX. Pharmacy verified.  Pt scheduled next AEX 11/2020

## 2020-07-08 ENCOUNTER — Other Ambulatory Visit: Payer: Self-pay | Admitting: Family Medicine

## 2020-08-25 NOTE — Progress Notes (Deleted)
GYNECOLOGY  VISIT  CC:   ***  HPI: 50 y.o. G95P3003 Married White or Caucasian female here for ***.     GYNECOLOGIC HISTORY: No LMP recorded. Patient has had an ablation. Contraception: *** Menopausal hormone therapy: ***  Patient Active Problem List   Diagnosis Date Noted  . Dyslipidemia 07/15/2019  . Left hip pain 07/10/2019  . Constipation 09/03/2018  . Varicose veins of both lower extremities with pain 04/20/2018  . Migraines 04/13/2018  . Hypothyroidism 04/13/2018  . 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 History of asthma 04/13/2018    Past Medical History:  Diagnosis Date  . Amenorrhea   . Arthritis   . Asthma   . Colon polyps   . Depression   . Frequent headaches   . Frequent refractory urinary tract infections   . Hay fever   . History of chicken pox   . Superficial thrombophlebitis    Post partum  . Thyroid disease     Past Surgical History:  Procedure Laterality Date  . ADENOIDECTOMY  1975  . BREAST BIOPSY  1995  . BREAST EXCISIONAL BIOPSY    . CERVICAL DISCECTOMY  2003  . CHOLECYSTECTOMY  2013  . COLPOSCOPY    . ENDOMETRIAL ABLATION    . THYROIDECTOMY  2018  . TUBAL LIGATION      MEDS:   Current Outpatient Medications on File Prior to Visit  Medication Sig Dispense Refill  . albuterol (VENTOLIN HFA) 108 (90 Base) MCG/ACT inhaler Inhale 2 puffs into the lungs every 4 (four) hours as needed for shortness of breath. 8 g 2  . ARMOUR THYROID 120 MG tablet TAKE 1 TABLET (120 MG TOTAL) BY MOUTH DAILY. 90 tablet 0  . buPROPion (WELLBUTRIN XL) 300 MG 24 hr tablet TAKE 1 TABLET BY MOUTH EVERY DAY 90 tablet 0  . cyclobenzaprine (FLEXERIL) 10 MG tablet TAKE 1 TABLET BY MOUTH THREE TIMES A DAY AS NEEDED FOR MUSCLE SPASMS 30 tablet 1  . estradiol (VIVELLE-DOT) 0.05 MG/24HR patch Place 1 patch (0.05 mg total) onto the skin 2 (two) times a week. 24 patch 2  . liothyronine (CYTOMEL) 5 MCG tablet Take 1 tablet (5 mcg total) by mouth 2 (two) times  daily. 180 tablet 0  . lubiprostone (AMITIZA) 24 MCG capsule Take 1 capsule (24 mcg total) by mouth 2 (two) times daily with a meal. (Patient not taking: Reported on 11/26/2019) 60 capsule 5  . Multiple Vitamin (MULTIVITAMIN) tablet Take 1 tablet by mouth daily.    . progesterone (PROMETRIUM) 100 MG capsule Take 1 capsule (100 mg total) by mouth daily. 90 capsule 2  . SUMAtriptan (IMITREX) 100 MG tablet Take 1 tablet (100 mg total) by mouth daily as needed. 10 tablet 0  . zonisamide (ZONEGRAN) 100 MG capsule TAKE 1 CAPSULE BY MOUTH EVERY DAY 90 capsule 1   No current facility-administered medications on file prior to visit.    ALLERGIES: Gluten meal, Oseltamivir, Other, Tamiflu [oseltamivir phosphate], and Zofran [ondansetron]  Family History  Problem Relation Age of Onset  . Depression Mother   . Pancreatitis Mother   . Alcohol abuse Father   . Depression Father   . Diabetes Father   . Early death Father   . Heart attack Father   . Heart disease Father   . Pancreatitis Father   . Depression Sister   . Hyperlipidemia Sister   . Mental illness Sister   . Mental illness Daughter   . Migraines Daughter   .  Depression Maternal Grandmother   . Early death Maternal Grandmother   . Hyperlipidemia Maternal Grandmother   . Hypertension Maternal Grandmother   . Stroke Maternal Grandmother   . Learning disabilities Maternal Grandmother   . Depression Maternal Grandfather   . Early death Maternal Grandfather   . Alcohol abuse Maternal Grandfather   . Learning disabilities Maternal Grandfather   . Epilepsy Maternal Grandfather   . Suicidality Maternal Grandfather   . Arthritis Paternal Grandmother   . Cancer Paternal Grandmother   . Hearing loss Paternal Grandmother   . Breast cancer Paternal Grandmother   . Alcohol abuse Paternal Grandfather   . Hearing loss Paternal Grandfather   . Heart disease Paternal Grandfather   . Depression Sister   . Depression Sister     SH:   ***  Review of Systems  PHYSICAL EXAMINATION:    There were no vitals taken for this visit.    General appearance: alert, cooperative, no acute distress  CV:  {Exam; heart brief:31539} Lungs:  {pe lungs ob:314451::"clear to auscultation, no wheezes, rales or rhonchi, symmetric air entry"} Breasts: {Exam; breast:13139::"normal appearance, no masses or tenderness"} Abdomen: soft, non-tender; bowel sounds normal; no masses,  no organomegaly Lymph:  no inguinal LAD noted  Pelvic: External genitalia:  no lesions              Urethra:  normal appearing urethra with no masses, tenderness or lesions              Bartholins and Skenes: normal                 Vagina: normal appearing vagina               Cervix: {CHL AMB PHY EX CERVIX NORM DEFAULT:[phone]::"no lesions"}              Bimanual Exam:  Uterus:  {CHL AMB PHY EX UTERUS NORM DEFAULT:[phone]::"normal size, contour, position, consistency, mobility, non-tender"}              Adnexa: {CHL AMB PHY EX ADNEXA NO MASS [phone]::"no mass, fullness, tenderness"}               Chaperone, ***, CMA, was present for exam.  Assessment: ***  Plan: ***   {NUMBERS; -10-45 JOINT ROM:10287} minutes of total time was spent for this patient encounter, including preparation, face-to-face counseling with the patient and coordination of care, and documentation of the encounter.

## 2020-08-27 ENCOUNTER — Ambulatory Visit: Payer: BC Managed Care – PPO | Admitting: Nurse Practitioner

## 2020-09-08 ENCOUNTER — Other Ambulatory Visit: Payer: Self-pay | Admitting: Family Medicine

## 2020-10-05 ENCOUNTER — Other Ambulatory Visit: Payer: Self-pay | Admitting: Family Medicine

## 2020-10-16 ENCOUNTER — Other Ambulatory Visit: Payer: Self-pay | Admitting: Family Medicine

## 2020-10-16 MED ORDER — BUPROPION HCL ER (XL) 300 MG PO TB24: 1.0000 | ORAL_TABLET | Freq: Every day | ORAL | 0 refills | Status: DC

## 2020-10-27 ENCOUNTER — Telehealth: Payer: Self-pay

## 2020-10-27 NOTE — Telephone Encounter (Signed)
[place] [facility] [place] RECORD [facility] Patient Name: Carrie [name] Gender: Female DOB: 31-Dec-1970 Age: 50 Y 80 M 20 D Return Phone Number: [phone] (Primary) Address: City/ State/ Zip: [place]  [address] Client [facility] [place] [facility] [place] [facility] Phys[facility] Dimas Chyle- MD Contact Type Call Who Is Calling Patient / Member / Family / Caregiver Call Type Triage / Clinical Relationship To Patient Self Return Phone Number [phone] (Primary) Chief Complaint CHEST PAIN - pain, pressure, heaviness or tightness Reason for Call Symptomatic / Request for [place] wants to schedule an appointment. Caller has chest tightness and nausea. Translation No Nurse Assessment Nurse: Eugenio Hoes, RN, Jenny Reichmann Date/Time (Eastern Time): 10/27/2020 12:57:31 PM Confirm and document reason for call. If symptomatic, describe symptoms. ---Caller states that she has had chest tightness started about 4 days ago. and nausea started last night. Denies any pain in chest; just has tightness and unable to really take a deep breath. Denies fever or cough. Does the patient have any new or worsening symptoms? ---Yes Will a triage be completed? ---Yes Related visit to physician within the last 2 weeks? ---No Does the PT have any chronic conditions? (i.e. diabetes, asthma, this includes High risk factors for pregnancy, etc.) ---Yes List chronic conditions. ---Asthma, thyroidectomy Is the patient pregnant or possibly pregnant? (Ask all females between the ages of 66-55) ---No Is this a behavioral health or substance abuse call? ---No Guidelines Guideline Title Affirmed Question Affirmed Notes Nurse Date/Time (Eastern Time) Breathing Difficulty History of prior "blood clot" in leg or lungs (i.e., deep Eugenio Hoes, RN, Jenny Reichmann 10/27/2020  1:00:00 PM PLEASE NOTE: All timestamps contained within this report are represented as [place] Standard Time. CONFIDENTIALTY NOTICE: This fax transmission is intended only for the addressee. It contains information that is legally privileged, confidential or otherwise protected from use or disclosure. If you are not the intended recipient, you are strictly prohibited from reviewing, disclosing, copying using or disseminating any of this information or taking any action in reliance on or regarding this information. If you have received this fax in error, please notify us immediately by telephone so that we can arrange for its return to [place]. Phone: [phone], Toll-Free: [phone], Fax: [phone] Page: 2 of 2 Call Id: [id number] Guidelines Guideline Title Affirmed Question Affirmed Notes Nurse Date/Time Eilene Ghazi Time) vein thrombosis, pulmonary embolism) Disp. Time Eilene Ghazi Time) Disposition Final User 10/27/2020 12:56:09 PM Send to Urgent Queue Jaclyn Prime 10/27/2020 1:03:26 PM Go to ED Now Yes Eugenio Hoes, RN, [place] Disagree/Comply Comply Caller Understands Yes PreDisposition Go to ED Care Advice Given Per Guideline GO TO ED NOW: * You need to be seen in the Emergency Department. * Go to the ED at ___________ [facility] now. Drive carefully. NOTE TO TRIAGER - DRIVING: * Another adult should drive. * Patient should not delay going to the emergency department. BRING MEDICINES: * Bring a list of your current medicines when you go to the Emergency Department (ER). * Bring the pill bottles too. This will help the doctor (or NP/PA) to make certain you are taking the right medicines and the right dose. CARE ADVICE given per Breathing Difficulty (Adult) guideline. Comments User: Baird Cancer, RN Date/Time Eilene Ghazi Time): 10/27/2020 1:04:15 PM caller disconnected call before being asked what facility she will go to Referrals GO TO [place]

## 2020-11-15 ENCOUNTER — Other Ambulatory Visit: Payer: Self-pay | Admitting: Family Medicine

## 2020-11-16 NOTE — Progress Notes (Deleted)
50 y.o. G86P3003 Married White or Caucasian Not Hispanic or Latino female here for annual exam.      No LMP recorded. Patient has had an ablation.          Sexually active: {yes no:314532}  The current method of family planning is {contraception:315051}.    Exercising: {yes no:314532}  {types:19826} Smoker:  {YES NO:22349}  Health Maintenance: Pap:04/22/19 WNL Hr HPV  12/2017 abnormal per patient in [place]  History of abnormal Pap: Yes, has had colposcopies, had one in 2019, was told to follow up in 1 year. Has had 3 colposcopies  MMG:  04/21/20 density D Bi-rads 1 neg  BMD:   None  Colonoscopy: 2015 TDaP:  *** Gardasil: ***   reports that she has never smoked. She has never used smokeless tobacco. She reports current alcohol use. She reports that she does not use drugs.  Past Medical History:  Diagnosis Date  . Amenorrhea   . Arthritis   . Asthma   . Colon polyps   . Depression   . Frequent headaches   . Frequent refractory urinary tract infections   . Hay fever   . History of chicken pox   . Superficial thrombophlebitis    Post partum  . Thyroid disease     Past Surgical History:  Procedure Laterality Date  . ADENOIDECTOMY  1975  . BREAST BIOPSY  1995  . BREAST EXCISIONAL BIOPSY    . CERVICAL DISCECTOMY  2003  . CHOLECYSTECTOMY  2013  . COLPOSCOPY    . ENDOMETRIAL ABLATION    . THYROIDECTOMY  2018  . TUBAL LIGATION      Current Outpatient Medications  Medication Sig Dispense Refill  . albuterol (VENTOLIN HFA) 108 (90 Base) MCG/ACT inhaler Inhale 2 puffs into the lungs every 4 (four) hours as needed for shortness of breath. 8 g 2  . ARMOUR THYROID 120 MG tablet TAKE 1 TABLET (120 MG TOTAL) BY MOUTH DAILY. 90 tablet 0  . buPROPion (WELLBUTRIN XL) 300 MG 24 hr tablet TAKE 1 TABLET BY MOUTH EVERY DAY 30 tablet 0  . cyclobenzaprine (FLEXERIL) 10 MG tablet TAKE 1 TABLET BY MOUTH THREE TIMES A DAY AS NEEDED FOR MUSCLE SPASMS 30 tablet 0  . estradiol (VIVELLE-DOT) 0.05  MG/24HR patch Place 1 patch (0.05 mg total) onto the skin 2 (two) times a week. 24 patch 2  . liothyronine (CYTOMEL) 5 MCG tablet Take 1 tablet (5 mcg total) by mouth 2 (two) times daily. 180 tablet 0  . lubiprostone (AMITIZA) 24 MCG capsule Take 1 capsule (24 mcg total) by mouth 2 (two) times daily with a meal. (Patient not taking: Reported on 11/26/2019) 60 capsule 5  . Multiple Vitamin (MULTIVITAMIN) tablet Take 1 tablet by mouth daily.    . progesterone (PROMETRIUM) 100 MG capsule Take 1 capsule (100 mg total) by mouth daily. 90 capsule 2  . SUMAtriptan (IMITREX) 100 MG tablet Take 1 tablet (100 mg total) by mouth daily as needed. 10 tablet 0  . zonisamide (ZONEGRAN) 100 MG capsule TAKE 1 CAPSULE BY MOUTH EVERY DAY 90 capsule 1   No current facility-administered medications for this visit.    Family History  Problem Relation Age of Onset  . Depression Mother   . Pancreatitis Mother   . Alcohol abuse Father   . Depression Father   . Diabetes Father   . Early death Father   . Heart attack Father   . Heart disease Father   . Pancreatitis Father   .  Depression Sister   . Hyperlipidemia Sister   . Mental illness Sister   . Mental illness Daughter   . Migraines Daughter   . Depression Maternal Grandmother   . Early death Maternal Grandmother   . Hyperlipidemia Maternal Grandmother   . Hypertension Maternal Grandmother   . Stroke Maternal Grandmother   . Learning disabilities Maternal Grandmother   . Depression Maternal Grandfather   . Early death Maternal Grandfather   . Alcohol abuse Maternal Grandfather   . Learning disabilities Maternal Grandfather   . Epilepsy Maternal Grandfather   . Suicidality Maternal Grandfather   . Arthritis Paternal Grandmother   . Cancer Paternal Grandmother   . Hearing loss Paternal Grandmother   . Breast cancer Paternal Grandmother   . Alcohol abuse Paternal Grandfather   . Hearing loss Paternal Grandfather   . Heart disease Paternal  Grandfather   . Depression Sister   . Depression Sister     Review of Systems  Exam:   There were no vitals taken for this visit.  Weight change: @WEIGHTCHANGE @ Height:      Ht Readings from Last 3 Encounters:  01/01/20 5' 9"  (1.753 m)  11/26/19 5' 9"  (1.753 m)  11/25/19 5' 9"  (1.753 m)    General appearance: alert, cooperative and appears stated age Head: Normocephalic, without obvious abnormality, atraumatic Neck: no adenopathy, supple, symmetrical, trachea midline and thyroid {CHL AMB PHY EX THYROID NORM [phone]::"normal to inspection and palpation"} Lungs: clear to auscultation bilaterally Cardiovascular: regular rate and rhythm Breasts: {Exam; breast:13139::"normal appearance, no masses or tenderness"} Abdomen: soft, non-tender; non distended,  no masses,  no organomegaly Extremities: extremities normal, atraumatic, no cyanosis or edema Skin: Skin color, texture, turgor normal. No rashes or lesions Lymph nodes: Cervical, supraclavicular, and axillary nodes normal. No abnormal inguinal nodes palpated Neurologic: Grossly normal   Pelvic: External genitalia:  no lesions              Urethra:  normal appearing urethra with no masses, tenderness or lesions              Bartholins and Skenes: normal                 Vagina: normal appearing vagina with normal color and discharge, no lesions              Cervix: {CHL AMB PHY EX CERVIX NORM DEFAULT:[phone]::"no lesions"}               Bimanual Exam:  Uterus:  {CHL AMB PHY EX UTERUS NORM DEFAULT:[phone]::"normal size, contour, position, consistency, mobility, non-tender"}              Adnexa: {CHL AMB PHY EX ADNEXA NO MASS DEFAULT:[phone]::"no mass, fullness, tenderness"}               Rectovaginal: Confirms               Anus:  normal sphincter tone, no lesions  *** chaperoned for the exam.  A:  Well Woman with normal exam  P:

## 2020-11-18 ENCOUNTER — Ambulatory Visit: Payer: Self-pay | Admitting: Obstetrics and Gynecology

## 2020-11-18 DIAGNOSIS — Z0289 Encounter for other administrative examinations: Secondary | ICD-10-CM

## 2020-12-19 ENCOUNTER — Other Ambulatory Visit: Payer: Self-pay | Admitting: Family Medicine

## 2021-01-25 ENCOUNTER — Other Ambulatory Visit: Payer: Self-pay | Admitting: Family Medicine

## 2021-02-04 ENCOUNTER — Telehealth (INDEPENDENT_AMBULATORY_CARE_PROVIDER_SITE_OTHER): Payer: BC Managed Care – PPO | Admitting: Family Medicine

## 2021-02-04 VITALS — Ht 69.0 in | Wt 160.0 lb

## 2021-02-04 DIAGNOSIS — U071 COVID-19: Secondary | ICD-10-CM

## 2021-02-04 DIAGNOSIS — G43909 Migraine, unspecified, not intractable, without status migrainosus: Secondary | ICD-10-CM | POA: Diagnosis not present

## 2021-02-04 DIAGNOSIS — J329 Chronic sinusitis, unspecified: Secondary | ICD-10-CM

## 2021-02-04 DIAGNOSIS — E039 Hypothyroidism, unspecified: Secondary | ICD-10-CM

## 2021-02-04 MED ORDER — AZELASTINE HCL 0.1 % NA SOLN: 2.0000 | Freq: Two times a day (BID) | NASAL | 12 refills | Status: DC

## 2021-02-04 NOTE — Progress Notes (Signed)
   Carrie [name] is a 50 y.o. female who presents today for a virtual office visit.  Assessment/Plan:  New/Acute Problems: Covid Still has some lingering issues but is now out of infectious period.  Discussed symptom management with patient.  She does have a small bout of sinusitis based on history.  Treat this with course of Astelin.  She is also having ongoing fatigue.  She will check with her endocrinologist about having thyroid levels and other blood work done to make sure that her medications do not need to be adjusted.  If she has persistent sinusitis despite above would consider course of antibiotics however we will pursue further work-up above first.  Encourage good oral hydration.  Discussed reasons to return to care.  Chronic Problems Addressed Today: Hypothyroidism Follows with endocrinology.  She is currently on Armour Thyroid and Cytomel.  She will be following up with her endocrinologist again soon at [facility].  Migraines Worsened due to recent COVID infection.  We will continue current migraine medications though if symptoms persist may need to make some adjustments or refer to neurology.     Subjective:  HPI:  Patient here for COVID follow-up.  She started having symptoms 2 weeks ago.  Home COVID test was positive 13 days ago.  Had several symptoms that have mostly improved.  Still has a bit of sinus congestion, headache, dizziness, and fatigue.  No fevers.  Overall feels like her breathing was okay.  She has been using over-the-counter supplements but is not sure if it is helping.       Objective/Observations  Physical Exam: Gen: NAD, resting comfortably Pulm: Normal work of breathing Neuro: Grossly normal, moves all extremities Psych: Normal affect and thought content  Virtual Visit via Video   I connected with Carrie [name] on 02/04/21 at  3:40 PM EDT by a video enabled telemedicine application and verified that I am speaking with the correct person using two  identifiers. The limitations of evaluation and management by telemedicine and the availability of in person appointments were discussed. The patient expressed understanding and agreed to proceed.   Patient location: Home Provider location: [place] participating in the virtual visit: Myself and Patient     [name]. [name], MD 02/04/2021 3:45 PM

## 2021-02-04 NOTE — Assessment & Plan Note (Signed)
Follows with endocrinology.  She is currently on Armour Thyroid and Cytomel.  She will be following up with her endocrinologist again soon at [facility].

## 2021-02-04 NOTE — Assessment & Plan Note (Signed)
Worsened due to recent COVID infection.  We will continue current migraine medications though if symptoms persist may need to make some adjustments or refer to neurology.

## 2021-02-07 ENCOUNTER — Encounter: Payer: Self-pay | Admitting: Family Medicine

## 2021-02-08 ENCOUNTER — Ambulatory Visit
Admission: EM | Admit: 2021-02-08 | Discharge: 2021-02-08 | Disposition: A | Discharge status: Home / Self Care | Payer: BC Managed Care – PPO

## 2021-02-08 ENCOUNTER — Encounter: Payer: Self-pay | Admitting: Emergency Medicine

## 2021-02-08 ENCOUNTER — Other Ambulatory Visit: Payer: Self-pay

## 2021-02-08 DIAGNOSIS — R5383 Other fatigue: Secondary | ICD-10-CM | POA: Diagnosis not present

## 2021-02-08 DIAGNOSIS — U099 Post covid-19 condition, unspecified: Secondary | ICD-10-CM

## 2021-02-08 DIAGNOSIS — R42 Dizziness and giddiness: Secondary | ICD-10-CM

## 2021-02-08 DIAGNOSIS — R4020 Unspecified coma: Secondary | ICD-10-CM

## 2021-02-08 DIAGNOSIS — R251 Tremor, unspecified: Secondary | ICD-10-CM | POA: Diagnosis not present

## 2021-02-08 DIAGNOSIS — R55 Syncope and collapse: Secondary | ICD-10-CM

## 2021-02-08 LAB — POCT URINALYSIS DIP (MANUAL ENTRY)
Bilirubin, UA: NEGATIVE
Blood, UA: NEGATIVE
Glucose, UA: NEGATIVE mg/dL
Ketones, POC UA: NEGATIVE mg/dL
Leukocytes, UA: NEGATIVE
Nitrite, UA: NEGATIVE
Protein Ur, POC: NEGATIVE mg/dL
Spec Grav, UA: 1.01 (ref 1.010–1.025)
Urobilinogen, UA: 0.2 E.U./dL
pH, UA: 7 (ref 5.0–8.0)

## 2021-02-08 MED ORDER — AMOXICILLIN-POT CLAVULANATE 875-125 MG PO TABS
1.0000 | ORAL_TABLET | Freq: Two times a day (BID) | ORAL | 0 refills | Status: DC
Start: 2021-02-08 — End: 2021-02-23

## 2021-02-08 NOTE — ED Triage Notes (Addendum)
Patient c/o fatigue, dizziness, and RT ear pain x 2 weeks.   Patient denies fever at home.  Patient endorses urinary frequency and " I'm peeing a lot at night".   Patient endorses " brain fog" and increased dizziness when standing.   Patient endorses diarrhea. Patient endorses nausea and decreased appetite.   Patient endorses ABD pain.  Patient endorses being COVID POSITIVE on January 22 2021.   Patient endorses a history of thyroid removal.   Patient has taken Sudafed, prescribed nasal spray, Ibuprofen, and Vit.D.

## 2021-02-08 NOTE — Discharge Instructions (Signed)
Your current condition warrants further evaluation and/or treatment which exceed services available to you in this urgent care setting. I have discussed with you your currrent condition and the need for further evaluation and/or treatment in an emergency department setting. In response to my medical recommendation, you have opted to go to the emergency department.

## 2021-02-08 NOTE — ED Provider Notes (Signed)
CHIEF COMPLAINT:   Chief Complaint  Patient presents with   Fatigue   Dizziness   Otalgia     SUBJECTIVE/HPI:   Dizziness Otalgia A very pleasant 50 y.o.Female presents today with dizziness, fatigue, right ear pain for the last 2 weeks.  Patient states she had an episode where she lost consciousness a few days ago.  Patient states that she remembers trying to sit down and then woke up in the floor.  She does not report any known head injury, but does not recall the event in its entirety.  Patient also reports some tremors.  She reports that she was positive for COVID-19 on July 15 and her symptoms have continued to worsen.  She also reports some shortness of breath at times.  Patient states she has tried using Sudafed, nasal spray, ibuprofen and vitamin D without improvement.  She also reports some "brain fog", diarrhea, nausea, decreased appetite and has a reported history of DVT. Patient does not report any chest pain, palpitations, visual changes, weakness, fever, chills.   has a past medical history of Amenorrhea, Arthritis, Asthma, Colon polyps, Depression, Frequent headaches, Frequent refractory urinary tract infections, Hay fever, History of chicken pox, Superficial thrombophlebitis, and Thyroid disease. ROS:  Review of Systems  HENT:  Positive for ear pain.   Neurological:  Positive for dizziness.  See Subjective/HPI Medications, Allergies and Problem List personally reviewed in Epic today OBJECTIVE:   Vitals:   02/08/21 1135  BP: 120/88  Pulse: 93  Resp: 17  Temp: 98.2 F (36.8 C)  SpO2: 98%    Physical Exam   General: Appears well-developed and well-nourished. No acute distress.  Patient appears ill. HEENT Head: Normocephalic and atraumatic.  Ears: Hearing grossly intact, no drainage.  Right TM bulging with clear effusion present.  Left TM WNL.  No erythema to TMs bilaterally.  Nose: No nasal deviation or rhinorrhea.  Mouth/Throat: No stridor or tracheal deviation.   Eyes: Conjunctivae and EOM are normal. No eye drainage or scleral icterus bilaterally.  Neck: Normal range of motion, neck is supple.  Cardiovascular: Normal rate . Regular rhythm; no murmurs, gallops, or rubs.  Pulm/Chest: No respiratory distress. Breath sounds normal bilaterally without wheezes, rhonchi, or rales.  Neurological: Alert and oriented to person, place, and time.  Skin: Skin is warm and dry.  Psychiatric: Normal mood, affect, behavior, and thought content.   Vital signs and nursing note reviewed.   Patient stable and cooperative with examination.  LABS/X-RAYS/EKG/MEDS:   Results for orders placed or performed during the hospital encounter of 02/08/21  POCT urinalysis dipstick  Result Value Ref Range   Color, UA yellow yellow   Clarity, UA clear clear   Glucose, UA negative negative mg/dL   Bilirubin, UA negative negative   Ketones, POC UA negative negative mg/dL   Spec Grav, UA 1.010 1.010 - 1.025   Blood, UA negative negative   pH, UA 7.0 5.0 - 8.0   Protein Ur, POC negative negative mg/dL   Urobilinogen, UA 0.2 0.2 or 1.0 E.U./dL   Nitrite, UA Negative Negative   Leukocytes, UA Negative Negative   -Urinalysis normal MEDICAL DECISION MAKING:   Patient presents with dizziness, fatigue, right ear pain for the last 2 weeks.  Patient states she had an episode where she lost consciousness a few days ago.  Patient states that she remembers trying to sit down and then woke up in the floor.  She does not report any known head injury, but does not recall  the event in its entirety.  Patient also reports some tremors.  She reports that she was positive for COVID-19 on July 15 and her symptoms have continued to worsen.  She also reports some shortness of breath at times.  Patient states she has tried using Sudafed, nasal spray, ibuprofen and vitamin D without improvement.  She also reports some "brain fog", diarrhea, nausea, decreased appetite and has a reported history of DVT.  Patient does not report any chest pain, palpitations, visual changes, weakness, fever, chills.  Chart review completed.  Urinalysis normal in clinic today.  Given symptoms of LOC, tremors, worsening symptoms after COVID-19, I feel as if the patient warrants further evaluation in the emergency department.  Patient appears unwell in clinic today.  Patient is stable to report to the emergency department via private vehicle driven by her husband.  Vitals also stable in clinic today.  Patient verbalized understanding and agreed with plan.  At this time, uncertain etiology for the patient's symptoms in clinic, but patient symptoms are quite concerning for worsening infection secondary to COVID-19.  ASSESSMENT/PLAN:  1. Loss of consciousness ([place])  2. Occasional tremors  3. Persistent fatigue after COVID-19  4. Dizziness  Instructions about new medications and side effects provided.  Plan:   Discharge Instructions      Your current condition warrants further evaluation and/or treatment which exceed services available to you in this urgent care setting. I have discussed with you your currrent condition and the need for further evaluation and/or treatment in an emergency department setting. In response to my medical recommendation, you have opted to go to the emergency department.         Serafina Royals, [place] 02/08/21 1217

## 2021-02-15 ENCOUNTER — Telehealth: Payer: Self-pay | Admitting: *Deleted

## 2021-02-15 DIAGNOSIS — Z79890 Hormone replacement therapy (postmenopausal): Secondary | ICD-10-CM

## 2021-02-15 MED ORDER — ESTRADIOL 0.05 MG/24HR TD PTTW: 1.0000 | MEDICATED_PATCH | TRANSDERMAL | 1 refills | Status: DC

## 2021-02-15 MED ORDER — PROGESTERONE MICRONIZED 100 MG PO CAPS: 100.0000 mg | ORAL_CAPSULE | Freq: Every day | ORAL | 1 refills | Status: DC

## 2021-02-15 NOTE — Telephone Encounter (Signed)
Patient called requesting refill on HRT vivelle dot patch and progesterone 100 mg capsule. Annual exam scheduled on 06/22/2021, last mammogram on 10/21. Please advise

## 2021-02-15 NOTE — Telephone Encounter (Signed)
Please send refills for her vivelle dot and prometrium to get her to her annual exam. Please make sure she is scheduled for a mammogram in the fall.

## 2021-02-15 NOTE — Telephone Encounter (Signed)
Patient aware, Rx sent. Aware she needs to schedule mammogram in the fall.

## 2021-02-23 ENCOUNTER — Ambulatory Visit: Payer: BC Managed Care – PPO | Admitting: Family Medicine

## 2021-02-23 ENCOUNTER — Encounter: Payer: Self-pay | Admitting: Family Medicine

## 2021-02-23 ENCOUNTER — Other Ambulatory Visit: Payer: Self-pay

## 2021-02-23 VITALS — BP 110/68 | HR 85 | Temp 97.5°F | Ht 69.0 in | Wt 162.0 lb

## 2021-02-23 DIAGNOSIS — G4700 Insomnia, unspecified: Secondary | ICD-10-CM

## 2021-02-23 DIAGNOSIS — F325 Major depressive disorder, single episode, in full remission: Secondary | ICD-10-CM

## 2021-02-23 DIAGNOSIS — E039 Hypothyroidism, unspecified: Secondary | ICD-10-CM | POA: Diagnosis not present

## 2021-02-23 DIAGNOSIS — H9209 Otalgia, unspecified ear: Secondary | ICD-10-CM | POA: Diagnosis not present

## 2021-02-23 MED ORDER — TRAZODONE HCL 50 MG PO TABS: 50.0000 mg | ORAL_TABLET | Freq: Every day | ORAL | 3 refills | Status: DC

## 2021-02-23 MED ORDER — AZITHROMYCIN 250 MG PO TABS: ORAL_TABLET | ORAL | 0 refills | Status: DC

## 2021-02-23 MED ORDER — PREDNISONE 50 MG PO TABS: ORAL_TABLET | ORAL | 0 refills | Status: DC

## 2021-02-23 NOTE — Assessment & Plan Note (Signed)
Also with endocrinology.  Recent labs within normal limits.

## 2021-02-23 NOTE — Patient Instructions (Signed)
It was very nice to see you today!  Am concerned she may have a sinus infection.  Please start the prednisone and azithromycin.  Please start trazodone to help you with sleep.  Send a message later this week let me know how you are doing.  Take care, Dr [name]  PLEASE NOTE:  If you had any lab tests please let us know if you have not heard back within a few days. You may see your results on mychart before we have a chance to review them but we will give you a call once they are reviewed by [place]. If we ordered any referrals today, please let us know if you have not heard from their office within the next week.   Please try these tips to maintain a healthy lifestyle:  Eat at least 3 REAL meals and 1-2 snacks per day.  Aim for no more than 5 hours between eating.  If you eat breakfast, please do so within one hour of getting up.   Each meal should contain half fruits/vegetables, one quarter protein, and one quarter carbs (no bigger than a computer mouse)  Cut down on sweet beverages. This includes juice, soda, and sweet tea.   Drink at least 1 glass of water with each meal and aim for at least 8 glasses per day  Exercise at least 150 minutes every week.

## 2021-02-23 NOTE — Progress Notes (Signed)
   Carrie [name] is a 50 y.o. female who presents today for an office visit.  Assessment/Plan:  New/Acute Problems: Sinusitis Failed Augmentin.  We will start prednisone and azithromycin today.  She can continue her nasal spray and other over-the-counter medications.  If not improving would consider CT scans or referral to ENT.  Chronic Problems Addressed Today: Insomnia Discussed treatment options.  Symptoms have worsened since having COVID recently.  She has had bad reaction to Benadryl in the past and does not want to try hydroxyzine.  We will start trazodone.  She will check in with me later this week.  We will consider trial of amitriptyline if symptoms or not well controlled.  Major depression in remission (HCC) Wellbutrin 300 mg daily.  Stable on this dose.  Hypothyroidism Also with endocrinology.  Recent labs within normal limits.     Subjective:  HPI:  Patient here for follow-up.  Had COVID 2 months ago.  Since then she has had persistent nasal congestion, fatigue, insomnia, and ear pain.  She was started on nasal spray and Augmentin which not help.  She feels some fluid in her bilateral ears.  Has continued issues with word finding difficulty and brain fog.  Symptoms are overall stable.  She is concerned about going back to work later this week.        Objective:  Physical Exam: BP 110/68   Pulse 85   Temp (!) 97.5 F (36.4 C) (Temporal)   Ht 5' 9"  (1.753 m)   Wt 162 lb (73.5 kg)   LMP 07/12/2011   SpO2 98%   BMI 23.92 kg/m   Gen: No acute distress, resting comfortably HEENT: TMs with clear effusion.  OP erythematous.  Maxillary sinuses with decreased transillumination bilaterally left worse than right. CV: Regular rate and rhythm with no murmurs appreciated Pulm: Normal work of breathing, clear to auscultation bilaterally with no crackles, wheezes, or rhonchi Neuro: Grossly normal, moves all extremities Psych: Normal affect and thought content      Masashi Snowdon  M. [name], MD 02/23/2021 9:48 AM

## 2021-02-23 NOTE — Assessment & Plan Note (Signed)
Discussed treatment options.  Symptoms have worsened since having COVID recently.  She has had bad reaction to Benadryl in the past and does not want to try hydroxyzine.  We will start trazodone.  She will check in with me later this week.  We will consider trial of amitriptyline if symptoms or not well controlled.

## 2021-02-23 NOTE — Assessment & Plan Note (Signed)
Wellbutrin 300 mg daily.  Stable on this dose.

## 2021-02-28 ENCOUNTER — Encounter: Payer: Self-pay | Admitting: Family Medicine

## 2021-03-02 ENCOUNTER — Other Ambulatory Visit: Payer: Self-pay

## 2021-03-02 DIAGNOSIS — U099 Post covid-19 condition, unspecified: Secondary | ICD-10-CM

## 2021-03-02 DIAGNOSIS — R5383 Other fatigue: Secondary | ICD-10-CM

## 2021-03-05 ENCOUNTER — Telehealth: Payer: Self-pay

## 2021-03-05 DIAGNOSIS — U099 Post covid-19 condition, unspecified: Secondary | ICD-10-CM

## 2021-03-05 NOTE — Telephone Encounter (Signed)
Patient was made aware referral was placed and was given number to [facility] pulmonology in [place]

## 2021-03-05 NOTE — Telephone Encounter (Signed)
Patient is calling in stating she called the pulmonologist was told that they wouldn't be able to treat her for fatigue, and that they are a pulmonology.

## 2021-03-08 NOTE — Telephone Encounter (Signed)
FYI

## 2021-03-09 NOTE — Telephone Encounter (Signed)
She was referred to the long covid clinic. Recommend she call back and tell them she needs to be seen for long covid symptoms.  [name]. [name], MD 03/09/2021 9:29 AM

## 2021-03-09 NOTE — Telephone Encounter (Signed)
Referral has been placed with correct diagnosis for long covid clinic.

## 2021-03-09 NOTE — Addendum Note (Signed)
Addended by: Clyde Lundborg A on: 03/09/2021 10:31 AM   Modules accepted: Orders

## 2021-04-19 ENCOUNTER — Encounter: Payer: Self-pay | Admitting: Family Medicine

## 2021-04-19 ENCOUNTER — Ambulatory Visit (INDEPENDENT_AMBULATORY_CARE_PROVIDER_SITE_OTHER): Payer: BC Managed Care – PPO | Admitting: Family Medicine

## 2021-04-19 ENCOUNTER — Other Ambulatory Visit: Payer: Self-pay

## 2021-04-19 VITALS — BP 121/79 | HR 90 | Temp 98.2°F | Ht 69.0 in | Wt 152.0 lb

## 2021-04-19 DIAGNOSIS — E039 Hypothyroidism, unspecified: Secondary | ICD-10-CM

## 2021-04-19 DIAGNOSIS — Z79899 Other long term (current) drug therapy: Secondary | ICD-10-CM | POA: Diagnosis not present

## 2021-04-19 DIAGNOSIS — G4700 Insomnia, unspecified: Secondary | ICD-10-CM

## 2021-04-19 DIAGNOSIS — E785 Hyperlipidemia, unspecified: Secondary | ICD-10-CM

## 2021-04-19 DIAGNOSIS — F325 Major depressive disorder, single episode, in full remission: Secondary | ICD-10-CM

## 2021-04-19 DIAGNOSIS — Z0001 Encounter for general adult medical examination with abnormal findings: Secondary | ICD-10-CM | POA: Diagnosis not present

## 2021-04-19 DIAGNOSIS — Z1211 Encounter for screening for malignant neoplasm of colon: Secondary | ICD-10-CM

## 2021-04-19 NOTE — Assessment & Plan Note (Signed)
Stable on Wellbutrin 300 mg daily.

## 2021-04-19 NOTE — Progress Notes (Signed)
Chief Complaint:  Carrie [name] is a 50 y.o. female who presents today for her annual comprehensive physical exam.    Assessment/Plan:  New/Acute Problems: Eustachian Tube Dysfunction Patient with bilateral middle ear effusion.  No signs of otitis media.  She will be following up with ENT in a few weeks.  She has hesitant to start prednisone due to concerns for worsening insomnia.  This was prescribed to her several weeks ago.  She has not found over-the-counter meds or nasal sprays to be effective.  We will continue with current management and she will follow-up with ENT in a few weeks.  Chronic Problems Addressed Today: Insomnia Increase trazodone to 100 mg daily as needed.  She will check with me in a few weeks via MyChart.  Dyslipidemia Check lipids.   Major depression in remission (HCC) Stable on Wellbutrin 300 mg daily.  Hypothyroidism Follows with endocrinology.  She is on Armour Thyroid.  We will check thyroid labs per patient request.   Preventative Healthcare: Cologuard ordered.   Patient Counseling(The following topics were reviewed and/or handout was given):  -Nutrition: Stressed importance of moderation in sodium/caffeine intake, saturated fat and cholesterol, caloric balance, sufficient intake of fresh fruits, vegetables, and fiber.  -Stressed the importance of regular exercise.   -Substance Abuse: Discussed cessation/primary prevention of tobacco, alcohol, or other drug use; driving or other dangerous activities under the influence; availability of treatment for abuse.   -Injury prevention: Discussed safety belts, safety helmets, smoke detector, smoking near bedding or upholstery.   -Sexuality: Discussed sexually transmitted diseases, partner selection, use of condoms, avoidance of unintended pregnancy and contraceptive alternatives.   -Dental health: Discussed importance of regular tooth brushing, flossing, and dental visits.  -Health maintenance and  immunizations reviewed. Please refer to Health maintenance section.  Return to care in 1 year for next preventative visit.     Subjective:  HPI:  She has no acute complaints today.   She has had ongoing issues with ear fullness and sinus congestion since having COVID several months ago.  We saw her a couple months ago and treated her for acute sinusitis.  She still has ongoing ear pain, fatigue, insomnia since then.  Most recently she was having issues with recurrent sinus infection.  Saw urgent care [place].  Was treated with a course of azithromycin.  Symptoms have improved though still has persistent pain in her right ear.  Lifestyle Diet: Balanced. Plenty of fruits and vegetables.  Exercise: Yoga and walking.   Depression screen [facility] 2/9 02/23/2021  Decreased Interest 0  Down, Depressed, Hopeless 0  PHQ - 2 Score 0  Altered sleeping -  Tired, decreased energy -  Change in appetite -  Feeling bad or failure about yourself  -  Trouble concentrating -  Moving slowly or fidgety/restless -  Suicidal thoughts -  PHQ-9 Score -  Difficult doing work/chores -    Health Maintenance Due  Topic Date Due   HIV Screening  Never done   Hepatitis C Screening  Never done   TETANUS/TDAP  Never done   COLONOSCOPY (Pts 45-9yr Insurance coverage will need to be confirmed)  Never done     ROS: Per HPI, otherwise a complete review of systems was negative.   PMH:  The following were reviewed and entered/updated in epic: Past Medical History:  Diagnosis Date   Amenorrhea    Arthritis    Asthma    Colon polyps    Depression    Frequent headaches  Frequent refractory urinary tract infections    Hay fever    History of chicken pox    Superficial thrombophlebitis    Post partum   Thyroid disease    Patient Active Problem List   Diagnosis Date Noted   Insomnia 02/23/2021   Dyslipidemia 07/15/2019   Left hip pain 07/10/2019   Constipation 09/03/2018   Varicose veins of both  lower extremities with pain 04/20/2018   Migraines 04/13/2018   Hypothyroidism 04/13/2018   Major depression in remission ([place]) 04/13/2018   History of asthma 04/13/2018   Past Surgical History:  Procedure Laterality Date   [place]   BREAST EXCISIONAL BIOPSY     CERVICAL DISCECTOMY  2003   CHOLECYSTECTOMY  2013   COLPOSCOPY     ENDOMETRIAL ABLATION     THYROIDECTOMY  2018   TUBAL LIGATION      Family History  Problem Relation Age of Onset   Depression Mother    Pancreatitis Mother    Alcohol abuse Father    Depression Father    Diabetes Father    Early death Father    Heart attack Father    Heart disease Father    Pancreatitis Father    Depression Sister    Hyperlipidemia Sister    Mental illness Sister    Mental illness Daughter    Migraines Daughter    Depression Maternal Grandmother    Early death Maternal Grandmother    Hyperlipidemia Maternal Grandmother    Hypertension Maternal Grandmother    Stroke Maternal Grandmother    Learning disabilities Maternal Grandmother    Depression Maternal Grandfather    Early death Maternal Grandfather    Alcohol abuse Maternal Grandfather    Learning disabilities Maternal Grandfather    Epilepsy Maternal Grandfather    Suicidality Maternal Grandfather    Arthritis Paternal Grandmother    Cancer Paternal Grandmother    Hearing loss Paternal Grandmother    Breast cancer Paternal Grandmother    Alcohol abuse Paternal Grandfather    Hearing loss Paternal Grandfather    Heart disease Paternal Grandfather    Depression Sister    Depression Sister     Medications- reviewed and updated Current Outpatient Medications  Medication Sig Dispense Refill   albuterol (VENTOLIN HFA) 108 (90 Base) MCG/ACT inhaler Inhale 2 puffs into the lungs every 4 (four) hours as needed for shortness of breath. 8 g 2   ARMOUR THYROID 120 MG tablet TAKE 1 TABLET (120 MG TOTAL) BY MOUTH DAILY. 90 tablet 0    azithromycin (ZITHROMAX) 250 MG tablet Take 2 tabs day 1, then 1 tab daily 6 each 0   buPROPion (WELLBUTRIN XL) 300 MG 24 hr tablet TAKE 1 TABLET BY MOUTH EVERY DAY 90 tablet 1   cyclobenzaprine (FLEXERIL) 10 MG tablet TAKE 1 TABLET BY MOUTH THREE TIMES A DAY AS NEEDED FOR MUSCLE SPASMS 30 tablet 0   estradiol (VIVELLE-DOT) 0.05 MG/24HR patch Place 1 patch (0.05 mg total) onto the skin 2 (two) times a week. 24 patch 1   Multiple Vitamin (MULTIVITAMIN) tablet Take 1 tablet by mouth daily.     progesterone (PROMETRIUM) 100 MG capsule Take 1 capsule (100 mg total) by mouth daily. 90 capsule 1   SUMAtriptan (IMITREX) 100 MG tablet Take 1 tablet (100 mg total) by mouth daily as needed. 10 tablet 0   topiramate (TOPAMAX) 50 MG tablet Take 50 mg by mouth 2 (two) times daily.  traZODone (DESYREL) 50 MG tablet Take 1 tablet (50 mg total) by mouth at bedtime. 90 tablet 3   No current facility-administered medications for this visit.    Allergies-reviewed and updated Allergies  Allergen Reactions   Gluten Meal    Oseltamivir Other (See Comments)    Severe jaw Pain   Other Other (See Comments)    Beef Causes stomach pain   Tamiflu [Oseltamivir Phosphate]    Zofran [Ondansetron]     rash    Social History   Socioeconomic History   Marital status: Married    Spouse name: Not on file   Number of children: Not on file   Years of education: Not on file   Highest education level: Not on file  Occupational History   Not on file  Tobacco Use   Smoking status: Never   Smokeless tobacco: Never  Vaping Use   Vaping Use: Never used  Substance and Sexual Activity   Alcohol use: Yes    Comment: 0-2 drinks a week   Drug use: Never   Sexual activity: Yes    Partners: Male    Birth control/protection: Surgical    Comment: tubal ligation  Other Topics Concern   Not on file  Social History Narrative   Not on file   Social Determinants of Health   Financial Resource Strain: Not on file   Food Insecurity: Not on file  Transportation Needs: Not on file  Physical Activity: Not on file  Stress: Not on file  Social Connections: Not on file        Objective:  Physical Exam: BP 121/79   Pulse 90   Temp 98.2 F (36.8 C) (Temporal)   Ht 5' 9"  (1.753 m)   Wt 152 lb (68.9 kg)   LMP 07/12/2011   SpO2 99%   BMI 22.45 kg/m   Body mass index is 22.45 kg/m. Wt Readings from Last 3 Encounters:  04/19/21 152 lb (68.9 kg)  02/23/21 162 lb (73.5 kg)  02/04/21 160 lb (72.6 kg)   Gen: NAD, resting comfortably HEENT: TMs with clear effusion bilaterally. OP clear. No thyromegaly noted.  CV: RRR with no murmurs appreciated Pulm: NWOB, CTAB with no crackles, wheezes, or rhonchi GI: Normal bowel sounds present. Soft, Nontender, Nondistended. MSK: no edema, cyanosis, or clubbing noted Skin: warm, dry Neuro: CN2-12 grossly intact. Strength 5/5 in upper and lower extremities. Reflexes symmetric and intact bilaterally.  Psych: Normal affect and thought content     Verne [place] M. [name], MD 04/19/2021 3:24 PM

## 2021-04-19 NOTE — Assessment & Plan Note (Signed)
Check lipids.

## 2021-04-19 NOTE — Patient Instructions (Signed)
It was very nice to see you today!  We will check blood work.  Please continue working on diet and exercise.  I think is a good idea for you to see an ear nose and throat doctor.  You can take 2 trazodone pills nightly.  This is 100 mg total.  I will see back in 1 year for your next physical.  Come back to see me sooner if needed.  Take care, Dr [name]  PLEASE NOTE:  If you had any lab tests please let us know if you have not heard back within a few days. You may see your results on mychart before we have a chance to review them but we will give you a call once they are reviewed by [place]. If we ordered any referrals today, please let us know if you have not heard from their office within the next week.   Please try these tips to maintain a healthy lifestyle:  Eat at least 3 REAL meals and 1-2 snacks per day.  Aim for no more than 5 hours between eating.  If you eat breakfast, please do so within one hour of getting up.   Each meal should contain half fruits/vegetables, one quarter protein, and one quarter carbs (no bigger than a computer mouse)  Cut down on sweet beverages. This includes juice, soda, and sweet tea.   Drink at least 1 glass of water with each meal and aim for at least 8 glasses per day  Exercise at least 150 minutes every week.    Preventive Care 38-62 Years Old, Female Preventive care refers to lifestyle choices and visits with your health care provider that can promote health and wellness. This includes: A yearly physical exam. This is also called an annual wellness visit. Regular dental and eye exams. Immunizations. Screening for certain conditions. Healthy lifestyle choices, such as: Eating a healthy diet. Getting regular exercise. Not using drugs or products that contain nicotine and tobacco. Limiting alcohol use. What can I expect for my preventive care visit? Physical exam Your health care provider will check your: Height and weight. These may be used to  calculate your BMI (body mass index). BMI is a measurement that tells if you are at a healthy weight. Heart rate and blood pressure. Body temperature. Skin for abnormal spots. Counseling Your health care provider may ask you questions about your: Past medical problems. Family's medical history. Alcohol, tobacco, and drug use. Emotional well-being. Home life and relationship well-being. Sexual activity. Diet, exercise, and sleep habits. Work and work Statistician. Access to firearms. Method of birth control. Menstrual cycle. Pregnancy history. What immunizations do I need? Vaccines are usually given at various ages, according to a schedule. Your health care provider will recommend vaccines for you based on your age, medical history, and lifestyle or other factors, such as travel or where you work. What tests do I need? Blood tests Lipid and cholesterol levels. These may be checked every 5 years, or more often if you are over 103 years old. Hepatitis C test. Hepatitis B test. Screening Lung cancer screening. You may have this screening every year starting at age 58 if you have a 30-pack-year history of smoking and currently smoke or have quit within the past 15 years. Colorectal cancer screening. All adults should have this screening starting at age 31 and continuing until age 34. Your health care provider may recommend screening at age 88 if you are at increased risk. You will have tests every 1-10 years,  depending on your results and the type of screening test. Diabetes screening. This is done by checking your blood sugar (glucose) after you have not eaten for a while (fasting). You may have this done every 1-3 years. Mammogram. This may be done every 1-2 years. Talk with your health care provider about when you should start having regular mammograms. This may depend on whether you have a family history of breast cancer. BRCA-related cancer screening. This may be done if you have a  family history of breast, ovarian, tubal, or peritoneal cancers. Pelvic exam and Pap test. This may be done every 3 years starting at age 42. Starting at age 59, this may be done every 5 years if you have a Pap test in combination with an HPV test. Other tests STD (sexually transmitted disease) testing, if you are at risk. Bone density scan. This is done to screen for osteoporosis. You may have this scan if you are at high risk for osteoporosis. Talk with your health care provider about your test results, treatment options, and if necessary, the need for more tests. Follow these instructions at home: Eating and drinking  Eat a diet that includes fresh fruits and vegetables, whole grains, lean protein, and low-fat dairy products. Take vitamin and mineral supplements as recommended by your health care provider. Do not drink alcohol if: Your health care provider tells you not to drink. You are pregnant, may be pregnant, or are planning to become pregnant. If you drink alcohol: Limit how much you have to 0-1 drink a day. Be aware of how much alcohol is in your drink. In the [place] one drink equals one 12 oz bottle of beer (355 mL), one 5 oz glass of wine (148 mL), or one 1 oz glass of hard liquor (44 mL). Lifestyle Take daily care of your teeth and gums. Brush your teeth every morning and night with fluoride toothpaste. Floss one time each day. Stay active. Exercise for at least 30 minutes 5 or more days each week. Do not use any products that contain nicotine or tobacco, such as cigarettes, e-cigarettes, and chewing tobacco. If you need help quitting, ask your health care provider. Do not use drugs. If you are sexually active, practice safe sex. Use a condom or other form of protection to prevent STIs (sexually transmitted infections). If you do not wish to become pregnant, use a form of birth control. If you plan to become pregnant, see your health care provider for a prepregnancy visit. If  told by your health care provider, take low-dose aspirin daily starting at age 42. Find healthy ways to cope with stress, such as: Meditation, yoga, or listening to music. Journaling. Talking to a trusted person. Spending time with friends and family. Safety Always wear your seat belt while driving or riding in a vehicle. Do not drive: If you have been drinking alcohol. Do not ride with someone who has been drinking. When you are tired or distracted. While texting. Wear a helmet and other protective equipment during sports activities. If you have firearms in your house, make sure you follow all gun safety procedures. What's next? Visit your health care provider once a year for an annual wellness visit. Ask your health care provider how often you should have your eyes and teeth checked. Stay up to date on all vaccines. This information is not intended to replace advice given to you by your health care provider. Make sure you discuss any questions you have with your health care  provider. Document Revised: 09/04/2020 Document Reviewed: 03/08/2018 Elsevier Patient Education  2022 [employer].

## 2021-04-19 NOTE — Assessment & Plan Note (Signed)
Follows with endocrinology.  She is on Armour Thyroid.  We will check thyroid labs per patient request.

## 2021-04-19 NOTE — Assessment & Plan Note (Signed)
Increase trazodone to 100 mg daily as needed.  She will check with me in a few weeks via MyChart.

## 2021-04-20 LAB — CBC
HCT: 40.2 % (ref 36.0–46.0)
Hemoglobin: 13.6 g/dL (ref 12.0–15.0)
MCHC: 33.7 g/dL (ref 30.0–36.0)
MCV: 92.7 fl (ref 78.0–100.0)
Platelets: 328 10*3/uL (ref 150.0–400.0)
RBC: 4.34 Mil/uL (ref 3.87–5.11)
RDW: 12.8 % (ref 11.5–15.5)
WBC: 6.5 10*3/uL (ref 4.0–10.5)

## 2021-04-20 LAB — COMPREHENSIVE METABOLIC PANEL
ALT: 14 U/L (ref 0–35)
AST: 15 U/L (ref 0–37)
Albumin: 4.2 g/dL (ref 3.5–5.2)
Alkaline Phosphatase: 62 U/L (ref 39–117)
BUN: 12 mg/dL (ref 6–23)
CO2: 24 mEq/L (ref 19–32)
Calcium: 9 mg/dL (ref 8.4–10.5)
Chloride: 107 mEq/L (ref 96–112)
Creatinine, Ser: 0.97 mg/dL (ref 0.40–1.20)
GFR: 68.81 mL/min (ref >60.00)
Glucose, Bld: 102 mg/dL — ABNORMAL HIGH (ref 70–99)
Potassium: 3.6 mEq/L (ref 3.5–5.1)
Sodium: 138 mEq/L (ref 135–145)
Total Bilirubin: 0.6 mg/dL (ref 0.2–1.2)
Total Protein: 6.6 g/dL (ref 6.0–8.3)

## 2021-04-20 LAB — LIPID PANEL
Cholesterol: 185 mg/dL (ref 0–200)
HDL: 42.8 mg/dL (ref >39.00)
LDL Cholesterol: 127 mg/dL — ABNORMAL HIGH (ref 0–99)
NonHDL: 141.89
Total CHOL/HDL Ratio: 4
Triglycerides: 76 mg/dL (ref 0.0–149.0)
VLDL: 15.2 mg/dL (ref 0.0–40.0)

## 2021-04-20 LAB — VITAMIN B12: Vitamin B-12: 290 pg/mL (ref 211–911)

## 2021-04-20 LAB — TSH: TSH: 3.8 u[IU]/mL (ref 0.35–5.50)

## 2021-04-20 LAB — T4, FREE: Free T4: 0.58 ng/dL — ABNORMAL LOW (ref 0.60–1.60)

## 2021-04-20 LAB — T3, FREE: T3, Free: 2.8 pg/mL (ref 2.3–4.2)

## 2021-04-20 LAB — VITAMIN D 25 HYDROXY (VIT D DEFICIENCY, FRACTURES): VITD: 34.5 ng/mL (ref 30.00–100.00)

## 2021-04-20 NOTE — Progress Notes (Signed)
Please inform patient of the following:  Her B12 is on the low side of normal.  This could explain some of her symptoms.  She can start the B12 protocol here or she can take 1000 mcg daily.  We should recheck in 3 to 6 months.  All her other blood work is stable.

## 2021-04-21 ENCOUNTER — Encounter: Payer: Self-pay | Admitting: Family Medicine

## 2021-04-21 ENCOUNTER — Other Ambulatory Visit: Payer: Self-pay | Admitting: *Deleted

## 2021-04-21 MED ORDER — PREDNISONE 50 MG PO TABS: ORAL_TABLET | ORAL | 0 refills | Status: DC

## 2021-04-21 NOTE — Telephone Encounter (Signed)
Please advise 

## 2021-04-21 NOTE — Telephone Encounter (Signed)
Rx send to pharmacy

## 2021-04-22 ENCOUNTER — Telehealth: Payer: Self-pay

## 2021-04-22 NOTE — Telephone Encounter (Signed)
Pt called for lab results. Please Advise.

## 2021-04-23 NOTE — Telephone Encounter (Signed)
Called and lm on pt vm tcb. 

## 2021-04-26 ENCOUNTER — Telehealth: Payer: Self-pay

## 2021-04-26 NOTE — Telephone Encounter (Signed)
Error

## 2021-05-03 ENCOUNTER — Other Ambulatory Visit: Payer: Self-pay

## 2021-05-03 ENCOUNTER — Ambulatory Visit (INDEPENDENT_AMBULATORY_CARE_PROVIDER_SITE_OTHER): Payer: BC Managed Care – PPO

## 2021-05-03 DIAGNOSIS — E538 Deficiency of other specified B group vitamins: Secondary | ICD-10-CM | POA: Diagnosis not present

## 2021-05-03 MED ORDER — CYANOCOBALAMIN 1000 MCG/ML IJ SOLN
1000.0000 ug | Freq: Once | INTRAMUSCULAR | Status: AC
  Administered 2021-05-03: 1000 ug via INTRAMUSCULAR

## 2021-05-03 NOTE — Progress Notes (Signed)
Patient presented for first B 12 injection to right deltoid, patient voiced no concerns nor showed any signs of distress during injection.   See B12 order by Dr. [name] on 04/19/2021

## 2021-05-10 ENCOUNTER — Encounter: Payer: Self-pay | Admitting: Family Medicine

## 2021-05-11 ENCOUNTER — Other Ambulatory Visit: Payer: Self-pay

## 2021-05-11 ENCOUNTER — Ambulatory Visit (INDEPENDENT_AMBULATORY_CARE_PROVIDER_SITE_OTHER): Payer: BC Managed Care – PPO

## 2021-05-11 DIAGNOSIS — E538 Deficiency of other specified B group vitamins: Secondary | ICD-10-CM | POA: Diagnosis not present

## 2021-05-11 MED ORDER — CYANOCOBALAMIN 1000 MCG/ML IJ SOLN
1000.0000 ug | Freq: Once | INTRAMUSCULAR | Status: AC
  Administered 2021-05-11: 1000 ug via INTRAMUSCULAR

## 2021-05-11 NOTE — Progress Notes (Signed)
Patient presented for B 12 injection to left deltoid, patient voiced no concerns nor showed any signs of distress during injection. 

## 2021-05-12 LAB — COLOGUARD: COLOGUARD: POSITIVE — AB

## 2021-05-12 NOTE — Telephone Encounter (Signed)
Lab results faxed x2 wrong faxed number  LVM to  patient for verification

## 2021-05-13 ENCOUNTER — Other Ambulatory Visit: Payer: Self-pay | Admitting: *Deleted

## 2021-05-13 DIAGNOSIS — R195 Other fecal abnormalities: Secondary | ICD-10-CM

## 2021-05-13 NOTE — Progress Notes (Signed)
Please inform patient of the following:  Cologuard positive. She will need to be referred to GI.  [name]. [name], MD 05/13/2021 10:29 AM

## 2021-05-18 ENCOUNTER — Ambulatory Visit: Payer: BC Managed Care – PPO

## 2021-05-18 ENCOUNTER — Encounter: Payer: Self-pay | Admitting: Obstetrics and Gynecology

## 2021-05-18 DIAGNOSIS — Z1231 Encounter for screening mammogram for malignant neoplasm of breast: Secondary | ICD-10-CM

## 2021-05-23 ENCOUNTER — Encounter: Payer: Self-pay | Admitting: Family Medicine

## 2021-05-26 NOTE — Telephone Encounter (Signed)
See note

## 2021-05-27 ENCOUNTER — Other Ambulatory Visit: Payer: Self-pay | Admitting: *Deleted

## 2021-05-31 NOTE — Telephone Encounter (Signed)
[facility] not longer available Ok to send to pulmonology?

## 2021-06-01 ENCOUNTER — Other Ambulatory Visit: Payer: Self-pay | Admitting: *Deleted

## 2021-06-01 DIAGNOSIS — G4700 Insomnia, unspecified: Secondary | ICD-10-CM

## 2021-06-01 DIAGNOSIS — R5383 Other fatigue: Secondary | ICD-10-CM

## 2021-06-01 NOTE — Telephone Encounter (Signed)
Referral placed.

## 2021-06-08 ENCOUNTER — Ambulatory Visit: Payer: BC Managed Care – PPO

## 2021-06-13 ENCOUNTER — Telehealth: Payer: BC Managed Care – PPO | Admitting: Family

## 2021-06-13 DIAGNOSIS — U099 Post covid-19 condition, unspecified: Secondary | ICD-10-CM | POA: Diagnosis not present

## 2021-06-13 DIAGNOSIS — R634 Abnormal weight loss: Secondary | ICD-10-CM | POA: Diagnosis not present

## 2021-06-13 DIAGNOSIS — R6889 Other general symptoms and signs: Secondary | ICD-10-CM | POA: Diagnosis not present

## 2021-06-13 DIAGNOSIS — R5383 Other fatigue: Secondary | ICD-10-CM | POA: Diagnosis not present

## 2021-06-13 DIAGNOSIS — R454 Irritability and anger: Secondary | ICD-10-CM

## 2021-06-13 DIAGNOSIS — E039 Hypothyroidism, unspecified: Secondary | ICD-10-CM

## 2021-06-13 NOTE — Progress Notes (Signed)
Virtual Visit Consent   Carrie [name], you are scheduled for a virtual visit with a [facility] provider today.     Just as with appointments in the office, your consent must be obtained to participate.  Your consent will be active for this visit and any virtual visit you may have with one of our providers in the next 365 days.     If you have a MyChart account, a copy of this consent can be sent to you electronically.  All virtual visits are billed to your insurance company just like a traditional visit in the office.    As this is a virtual visit, video technology does not allow for your provider to perform a traditional examination.  This may limit your provider's ability to fully assess your condition.  If your provider identifies any concerns that need to be evaluated in person or the need to arrange testing (such as labs, EKG, etc.), we will make arrangements to do so.     Although advances in technology are sophisticated, we cannot ensure that it will always work on either your end or our end.  If the connection with a video visit is poor, the visit may have to be switched to a telephone visit.  With either a video or telephone visit, we are not always able to ensure that we have a secure connection.     I need to obtain your verbal consent now.   Are you willing to proceed with your visit today?    [name] has provided verbal consent on 06/13/2021 for a virtual visit (video or telephone).   [name], FNP   Date: 06/13/2021 12:20 PM   Virtual Visit via Video Note   I, [name], connected with  Carrie [name]  ([id number], 12/17/1970) on 06/13/21 at 12:15 PM EST by a video-enabled telemedicine application and verified that I am speaking with the correct person using two identifiers.  Location: Patient: Virtual Visit Location Patient: Home Provider: Virtual Visit Location Provider: Home Office   I discussed the limitations of evaluation and management by  telemedicine and the availability of in person appointments. The patient expressed understanding and agreed to proceed.    History of Present Illness: Carrie [name] is a 50 y.o. who identifies as a female who was assigned female at birth, and is being seen today for to discuss symptoms. 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 She is experiencing urinary frequency, increased thirst, cold intolerance, weight loss of 30 lbs, muscle cramps, fatigue, pruritis  and irritable.   She is worried she had DM. She had a visit with her PCP on 04/19/21 and had a normal TSH and a non-fasting glucose of 102.   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   History of asthma 04/13/2018    Allergies:  Allergies  Allergen Reactions   Gluten Meal    Oseltamivir Other (See Comments)    Severe jaw Pain   Other Other (See Comments)    Beef Causes stomach pain   Tamiflu [Oseltamivir Phosphate]    Zofran [Ondansetron]     rash   Medications:  Current Outpatient Medications:  albuterol (VENTOLIN HFA) 108 (90 Base) MCG/ACT inhaler, Inhale 2 puffs into the lungs every 4 (four) hours as needed for shortness of breath., Disp: 8 g, Rfl: 2   ARMOUR THYROID 120 MG tablet, TAKE 1 TABLET (120 MG TOTAL) BY MOUTH DAILY., Disp: 90 tablet, Rfl: 0   buPROPion (WELLBUTRIN XL) 300 MG 24 hr tablet, TAKE 1 TABLET BY MOUTH EVERY DAY, Disp: 90 tablet, Rfl: 1   cyclobenzaprine (FLEXERIL) 10 MG tablet, TAKE 1 TABLET BY MOUTH THREE TIMES A DAY AS NEEDED FOR MUSCLE SPASMS, Disp: 30 tablet, Rfl: 0   estradiol (VIVELLE-DOT) 0.05 MG/24HR patch, Place 1 patch (0.05 mg total) onto the skin 2 (two) times a  week., Disp: 24 patch, Rfl: 1   Multiple Vitamin (MULTIVITAMIN) tablet, Take 1 tablet by mouth daily., Disp: , Rfl:    progesterone (PROMETRIUM) 100 MG capsule, Take 1 capsule (100 mg total) by mouth daily., Disp: 90 capsule, Rfl: 1   SUMAtriptan (IMITREX) 100 MG tablet, Take 1 tablet (100 mg total) by mouth daily as needed., Disp: 10 tablet, Rfl: 0   topiramate (TOPAMAX) 50 MG tablet, Take 50 mg by mouth 2 (two) times daily., Disp: , Rfl:    traZODone (DESYREL) 50 MG tablet, Take 1 tablet (50 mg total) by mouth at bedtime., Disp: 90 tablet, Rfl: 3  Observations/Objective: Patient is well-developed, well-nourished in no acute distress.  Resting comfortably  at home.  Head is normocephalic, atraumatic.  No labored breathing.  Speech is clear and coherent with logical content.  Patient is alert and oriented at baseline.    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 Other fatigue  3. Weight loss, unintentional  4. Cold intolerance  5. Irritable  6. Hypothyroidism, unspecified type Discussion with patient and reviewed her lab work. Her symptoms do not fit long haul COVID and recommend following up with her PCP to discuss. Her glucose was stable and I do not believe she has DM and her TSH was normal.   Encourage healthy diet and exercise   Follow Up Instructions: I discussed the assessment and treatment plan with the patient. The patient was provided an opportunity to ask questions and all were answered. The patient agreed with the plan and demonstrated an understanding of the instructions.  A copy of instructions were sent to the patient via MyChart unless otherwise noted below.     The patient was advised to call back or seek an in-person evaluation if the symptoms worsen or if the condition fails to improve as anticipated.  Time:  I spent 15 minutes with the patient via telehealth technology discussing the above problems/concerns.    [name], FNP

## 2021-06-15 ENCOUNTER — Encounter: Payer: Self-pay | Admitting: Family Medicine

## 2021-06-15 ENCOUNTER — Telehealth (INDEPENDENT_AMBULATORY_CARE_PROVIDER_SITE_OTHER): Payer: BC Managed Care – PPO | Admitting: Family Medicine

## 2021-06-15 VITALS — Ht 69.0 in | Wt 140.0 lb

## 2021-06-15 DIAGNOSIS — E039 Hypothyroidism, unspecified: Secondary | ICD-10-CM | POA: Diagnosis not present

## 2021-06-15 DIAGNOSIS — R5383 Other fatigue: Secondary | ICD-10-CM | POA: Diagnosis not present

## 2021-06-15 DIAGNOSIS — G4700 Insomnia, unspecified: Secondary | ICD-10-CM

## 2021-06-15 DIAGNOSIS — U099 Post covid-19 condition, unspecified: Secondary | ICD-10-CM

## 2021-06-15 NOTE — Assessment & Plan Note (Signed)
Continue trazodone 50 mg nightly as needed.

## 2021-06-15 NOTE — Progress Notes (Signed)
   Carrie [name] is a 50 y.o. female who presents today for a virtual office visit.  Assessment/Plan:  New/Acute Problems: Brain Fog / Fatigue / Weight Loss Likely due to sequela of COVID infection from a few months ago though we will check labs to rule out other possible causes.  We will check a set of labs a couple of months ago which showed slight B12 deficiency that she is already on replacement for.  She is on thyroid replacement and this may be causing some of her symptoms if she is not adequately getting thyroid hormone.  We will also check ANA and inflammatory markers.  She has a referral pending to [facility].  Positive Cologuard Has been referred to GI - will check on status of referral today.   Chronic Problems Addressed Today: Insomnia Continue trazodone 50 mg nightly as needed.  Hypothyroidism She is on Armour Thyroid 120 mg daily.  We will check labs today.  Her T4 was a little low a couple of months ago.  This could be contributing some to her symptoms.  She was on Cytomel in the past however is been off this for several months as she has not been able to find a prescriber in [place].     Subjective:  HPI:  Patient here with ongoing issues with fatigue since having covid about 4-5 months ago.  We saw her last couple of months ago for her annual physical.  Check labs at that time.  She was found to have low B12 and has been started on B12 replacement.  She has not felt like this is helped much with her symptoms.  She has a lot of brain fog.  She is also concerned about weight loss.  She has had some itching on her neck.  She is concerned about possible autoimmune or underlying diabetes.  Does have a family history of diabetes.  She feels very thirsty.  Has a dry mouth.  No cough.  No shortness of breath.       Objective/Observations  Physical Exam: Gen: NAD, resting comfortably Pulm: Normal work of breathing Neuro: Grossly normal, moves all  extremities Psych: Normal affect and thought content  Virtual Visit via Video   I connected with Carrie [name] on 06/15/21 at  3:40 PM EST by a video enabled telemedicine application and verified that I am speaking with the correct person using two identifiers. The limitations of evaluation and management by telemedicine and the availability of in person appointments were discussed. The patient expressed understanding and agreed to proceed.   Patient location: Home Provider location: [facility] participating in the virtual visit: Myself and Patient     [name]. [name], MD 06/15/2021 4:04 PM

## 2021-06-15 NOTE — Progress Notes (Deleted)
50 y.o. G45P3003 Married White or Caucasian Not Hispanic or Latino female here for annual exam.      Patient's last menstrual period was 07/12/2011.          Sexually active: {yes no:314532}  The current method of family planning is {contraception:315051}.    Exercising: {yes no:314532}  {types:19826} Smoker:  {YES NO:22349}  Health Maintenance: Pap:  04/22/19 WNL Hr HPV Neg  History of abnormal Pap:   Yes, has had colposcopies, had one in 2019, was told to follow up in 1 year. Has had 3 colposcopies MMG:  04/21/20 Bi-rads 1 neg  BMD:   none  Colonoscopy: 2015 polyps  TDaP:  *** Gardasil: n/a   reports that she has never smoked. She has never used smokeless tobacco. She reports current alcohol use. She reports that she does not use drugs.  Past Medical History:  Diagnosis Date   Amenorrhea    Arthritis    Asthma    Colon polyps    Depression    Frequent headaches    Frequent refractory urinary tract infections    Hay fever    History of chicken pox    Superficial thrombophlebitis    Post partum   Thyroid disease     Past Surgical History:  Procedure Laterality Date   ADENOIDECTOMY  1975   BREAST BIOPSY  1995   BREAST EXCISIONAL BIOPSY     CERVICAL DISCECTOMY  2003   CHOLECYSTECTOMY  2013   COLPOSCOPY     ENDOMETRIAL ABLATION     THYROIDECTOMY  2018   TUBAL LIGATION      Current Outpatient Medications  Medication Sig Dispense Refill   albuterol (VENTOLIN HFA) 108 (90 Base) MCG/ACT inhaler Inhale 2 puffs into the lungs every 4 (four) hours as needed for shortness of breath. 8 g 2   ARMOUR THYROID 120 MG tablet TAKE 1 TABLET (120 MG TOTAL) BY MOUTH DAILY. 90 tablet 0   buPROPion (WELLBUTRIN XL) 300 MG 24 hr tablet TAKE 1 TABLET BY MOUTH EVERY DAY 90 tablet 1   cyclobenzaprine (FLEXERIL) 10 MG tablet TAKE 1 TABLET BY MOUTH THREE TIMES A DAY AS NEEDED FOR MUSCLE SPASMS 30 tablet 0   estradiol (VIVELLE-DOT) 0.05 MG/24HR patch Place 1 patch (0.05 mg total) onto the skin  2 (two) times a week. 24 patch 1   Multiple Vitamin (MULTIVITAMIN) tablet Take 1 tablet by mouth daily.     progesterone (PROMETRIUM) 100 MG capsule Take 1 capsule (100 mg total) by mouth daily. 90 capsule 1   SUMAtriptan (IMITREX) 100 MG tablet Take 1 tablet (100 mg total) by mouth daily as needed. 10 tablet 0   topiramate (TOPAMAX) 50 MG tablet Take 50 mg by mouth 2 (two) times daily.     traZODone (DESYREL) 50 MG tablet Take 1 tablet (50 mg total) by mouth at bedtime. 90 tablet 3   No current facility-administered medications for this visit.    Family History  Problem Relation Age of Onset   Depression Mother    Pancreatitis Mother    Alcohol abuse Father    Depression Father    Diabetes Father    Early death Father    Heart attack Father    Heart disease Father    Pancreatitis Father    Depression Sister    Hyperlipidemia Sister    Mental illness Sister    Mental illness Daughter    Migraines Daughter    Depression Maternal Grandmother    Early death  Maternal Grandmother    Hyperlipidemia Maternal Grandmother    Hypertension Maternal Grandmother    Stroke Maternal Grandmother    Learning disabilities Maternal Grandmother    Depression Maternal Grandfather    Early death Maternal Grandfather    Alcohol abuse Maternal Grandfather    Learning disabilities Maternal Grandfather    Epilepsy Maternal Grandfather    Suicidality Maternal Grandfather    Arthritis Paternal Grandmother    Cancer Paternal Grandmother    Hearing loss Paternal Grandmother    Breast cancer Paternal Grandmother    Alcohol abuse Paternal Grandfather    Hearing loss Paternal Grandfather    Heart disease Paternal Grandfather    Depression Sister    Depression Sister     Review of Systems  Exam:   LMP 07/12/2011   Weight change: @WEIGHTCHANGE @ Height:      Ht Readings from Last 3 Encounters:  04/19/21 5' 9"  (1.753 m)  02/23/21 5' 9"  (1.753 m)  02/04/21 5' 9"  (1.753 m)    General appearance:  alert, cooperative and appears stated age Head: Normocephalic, without obvious abnormality, atraumatic Neck: no adenopathy, supple, symmetrical, trachea midline and thyroid {CHL AMB PHY EX THYROID NORM DEFAULT:[phone]::"normal to inspection and palpation"} Lungs: clear to auscultation bilaterally Cardiovascular: regular rate and rhythm Breasts: {Exam; breast:13139::"normal appearance, no masses or tenderness"} Abdomen: soft, non-tender; non distended,  no masses,  no organomegaly Extremities: extremities normal, atraumatic, no cyanosis or edema Skin: Skin color, texture, turgor normal. No rashes or lesions Lymph nodes: Cervical, supraclavicular, and axillary nodes normal. No abnormal inguinal nodes palpated Neurologic: Grossly normal   Pelvic: External genitalia:  no lesions              Urethra:  normal appearing urethra with no masses, tenderness or lesions              Bartholins and Skenes: normal                 Vagina: normal appearing vagina with normal color and discharge, no lesions              Cervix: {CHL AMB PHY EX CERVIX NORM DEFAULT:[phone]::"no lesions"}               Bimanual Exam:  Uterus:  {CHL AMB PHY EX UTERUS NORM DEFAULT:[phone]::"normal size, contour, position, consistency, mobility, non-tender"}              Adnexa: {CHL AMB PHY EX ADNEXA NO MASS DEFAULT:[phone]::"no mass, fullness, tenderness"}               Rectovaginal: Confirms               Anus:  normal sphincter tone, no lesions  *** chaperoned for the exam.  A:  Well Woman with normal exam  P:

## 2021-06-15 NOTE — Assessment & Plan Note (Signed)
She is on Armour Thyroid 120 mg daily.  We will check labs today.  Her T4 was a little low a couple of months ago.  This could be contributing some to her symptoms.  She was on Cytomel in the past however is been off this for several months as she has not been able to find a prescriber in [place].

## 2021-06-16 NOTE — Telephone Encounter (Signed)
Please advise 

## 2021-06-18 NOTE — Telephone Encounter (Signed)
We place a referral several weeks ago - can we make sure that it is in process?   [name]. [name], MD

## 2021-06-21 ENCOUNTER — Ambulatory Visit (INDEPENDENT_AMBULATORY_CARE_PROVIDER_SITE_OTHER): Payer: BC Managed Care – PPO

## 2021-06-21 ENCOUNTER — Other Ambulatory Visit: Payer: Self-pay

## 2021-06-21 ENCOUNTER — Other Ambulatory Visit (INDEPENDENT_AMBULATORY_CARE_PROVIDER_SITE_OTHER): Payer: BC Managed Care – PPO

## 2021-06-21 DIAGNOSIS — E538 Deficiency of other specified B group vitamins: Secondary | ICD-10-CM

## 2021-06-21 DIAGNOSIS — U099 Post covid-19 condition, unspecified: Secondary | ICD-10-CM

## 2021-06-21 DIAGNOSIS — R5383 Other fatigue: Secondary | ICD-10-CM

## 2021-06-21 DIAGNOSIS — G4700 Insomnia, unspecified: Secondary | ICD-10-CM

## 2021-06-21 DIAGNOSIS — E039 Hypothyroidism, unspecified: Secondary | ICD-10-CM | POA: Diagnosis not present

## 2021-06-21 MED ORDER — CYANOCOBALAMIN 1000 MCG/ML IJ SOLN
1000.0000 ug | Freq: Once | INTRAMUSCULAR | Status: AC
Start: 2021-06-21 — End: 2021-06-21
  Administered 2021-06-21: 1000 ug via INTRAMUSCULAR

## 2021-06-21 NOTE — Progress Notes (Signed)
Patient presented for B 12 injection to left deltoid, patient voiced no concerns nor showed any signs of distress during injection. 

## 2021-06-22 ENCOUNTER — Ambulatory Visit: Payer: BC Managed Care – PPO | Admitting: Obstetrics and Gynecology

## 2021-06-22 ENCOUNTER — Telehealth: Payer: Self-pay

## 2021-06-22 LAB — T4, FREE: Free T4: 0.61 ng/dL (ref 0.60–1.60)

## 2021-06-22 LAB — CBC
HCT: 41 % (ref 36.0–46.0)
Hemoglobin: 13.9 g/dL (ref 12.0–15.0)
MCHC: 34 g/dL (ref 30.0–36.0)
MCV: 92.3 fl (ref 78.0–100.0)
Platelets: 282 10*3/uL (ref 150.0–400.0)
RBC: 4.44 Mil/uL (ref 3.87–5.11)
RDW: 12.3 % (ref 11.5–15.5)
WBC: 5.9 10*3/uL (ref 4.0–10.5)

## 2021-06-22 LAB — TSH: TSH: 5.47 u[IU]/mL (ref 0.35–5.50)

## 2021-06-22 LAB — T3, FREE: T3, Free: 3.4 pg/mL (ref 2.3–4.2)

## 2021-06-22 LAB — C-REACTIVE PROTEIN: CRP: <1 mg/dL (ref 0.5–20.0)

## 2021-06-22 LAB — COMPREHENSIVE METABOLIC PANEL
ALT: 10 U/L (ref 0–35)
AST: 10 U/L (ref 0–37)
Albumin: 4.3 g/dL (ref 3.5–5.2)
Alkaline Phosphatase: 61 U/L (ref 39–117)
BUN: 15 mg/dL (ref 6–23)
CO2: 23 mEq/L (ref 19–32)
Calcium: 8.9 mg/dL (ref 8.4–10.5)
Chloride: 107 mEq/L (ref 96–112)
Creatinine, Ser: 0.99 mg/dL (ref 0.40–1.20)
GFR: 67.06 mL/min (ref >60.00)
Glucose, Bld: 86 mg/dL (ref 70–99)
Potassium: 3.6 mEq/L (ref 3.5–5.1)
Sodium: 137 mEq/L (ref 135–145)
Total Bilirubin: 0.5 mg/dL (ref 0.2–1.2)
Total Protein: 6.4 g/dL (ref 6.0–8.3)

## 2021-06-22 LAB — SEDIMENTATION RATE: Sed Rate: 6 mm/hr (ref 0–20)

## 2021-06-22 LAB — HEMOGLOBIN A1C: Hgb A1c MFr Bld: 5.1 % (ref 4.6–6.5)

## 2021-06-22 NOTE — Telephone Encounter (Signed)
Spoke with patient and she is agreeable. Message sent to appointment desk to all her and arrange AEX in January per Dr. Dewitt Hoes.

## 2021-06-22 NOTE — Telephone Encounter (Signed)
She was prescribed 3 months of HRT with one refill in 8/22. She should have enough to get her to 2/23. Please find her an appointment for an annual in January.

## 2021-06-22 NOTE — Telephone Encounter (Signed)
Pt called and states that she has been battling with COVID/symptoms since 01/2021 and reports today is a really bad day for her and feels as if she needs to r/s her appointment for this afternoon. However, she is very concerned that if she r/s she will not be able to get a refill on her HRT. Reports it took her months to get this appt scheduled and is scheduled to have her mammo in 07/2021. Please advise.

## 2021-06-23 ENCOUNTER — Encounter: Payer: Self-pay | Admitting: Otolaryngology

## 2021-06-24 LAB — ANTI-NUCLEAR AB-TITER (ANA TITER)
ANA TITER: 1:80 {titer} — ABNORMAL HIGH
ANA Titer 1: 1:80 {titer} — ABNORMAL HIGH

## 2021-06-24 LAB — ANA: Anti Nuclear Antibody (ANA): POSITIVE — AB

## 2021-06-25 ENCOUNTER — Other Ambulatory Visit: Payer: Self-pay | Admitting: *Deleted

## 2021-06-25 DIAGNOSIS — R768 Other specified abnormal immunological findings in serum: Secondary | ICD-10-CM

## 2021-06-25 NOTE — Progress Notes (Signed)
Please inform patient of the following:  One of her screening tests for autoimmune disease was mildly positive. This could explain some of her symptoms. Recommend referral to rheumatology for further evaluation if she is interested.  Her thyroid function is within the normal limits but close to borderline. It may make sense to talk about adjusting some of her medications but I would like for her to see rheumatology first.  Everything else is NORMAL.  [name]. [name], MD 06/25/2021 7:56 AM

## 2021-06-27 ENCOUNTER — Encounter: Payer: Self-pay | Admitting: Family Medicine

## 2021-06-28 NOTE — Telephone Encounter (Signed)
AEX scheduled for 07/20/21.

## 2021-07-02 ENCOUNTER — Encounter: Payer: Self-pay | Admitting: Internal Medicine

## 2021-07-11 HISTORY — PX: ESOPHAGOGASTRODUODENOSCOPY: SHX1529

## 2021-07-13 ENCOUNTER — Other Ambulatory Visit: Payer: Self-pay

## 2021-07-13 ENCOUNTER — Ambulatory Visit (INDEPENDENT_AMBULATORY_CARE_PROVIDER_SITE_OTHER): Payer: BC Managed Care – PPO

## 2021-07-13 ENCOUNTER — Other Ambulatory Visit (INDEPENDENT_AMBULATORY_CARE_PROVIDER_SITE_OTHER): Payer: BC Managed Care – PPO

## 2021-07-13 DIAGNOSIS — U099 Post covid-19 condition, unspecified: Secondary | ICD-10-CM

## 2021-07-13 DIAGNOSIS — E039 Hypothyroidism, unspecified: Secondary | ICD-10-CM

## 2021-07-13 DIAGNOSIS — G4700 Insomnia, unspecified: Secondary | ICD-10-CM

## 2021-07-13 DIAGNOSIS — R5383 Other fatigue: Secondary | ICD-10-CM | POA: Diagnosis not present

## 2021-07-13 DIAGNOSIS — E538 Deficiency of other specified B group vitamins: Secondary | ICD-10-CM

## 2021-07-13 MED ORDER — CYANOCOBALAMIN 1000 MCG/ML IJ SOLN
1000.0000 ug | Freq: Once | INTRAMUSCULAR | Status: AC
  Administered 2021-07-13: 1000 ug via INTRAMUSCULAR

## 2021-07-13 NOTE — Progress Notes (Addendum)
Pt here for b12 injection. Given in right arm. Tolerated well. No complaints or concerns at this time.

## 2021-07-14 LAB — VITAMIN B12: Vitamin B-12: 429 pg/mL (ref 211–911)

## 2021-07-14 NOTE — Progress Notes (Signed)
Please inform patient of the following:  B12 level is improving. She can switch to once daily 1077mg supplementation if she wishes and we can recheck in 3-6 months.

## 2021-07-14 NOTE — Progress Notes (Signed)
51 y.o. G30P3003 Married White or Caucasian Not Hispanic or Latino female here for annual exam.  H/O endometrial ablation. On HRT for severe vasomotor symptoms. Vasomotor symptoms are controlled.  She c/o low libido, since she got covid last summer. She is able to enjoy sex, rare to orgasm.    She had covid in 7/22, she hasn't fully recovered. Fatigue, brain fog, nausea, no appetite, body aching, insomnia.  She has lost 30 lbs in <6 months.   H/O chronic pain. Started low dose naltrexone for the chronic pain.   She has a h/o a thyroidectomy, is on armour thyroid (hasn't done well on synthroid).    H/O clot PP, it was a tender lump behind her knee, hot to the touch. The patient states there was no deep clot on imaging. She was given short term coumadin. She was on celebrex and bed rest.   Patient's last menstrual period was 07/12/2011.          Sexually active: Yes.    The current method of family planning is tubal ligation.    Exercising: Yes.     Smoker:  no  Health Maintenance: Pap:04/22/19 wnl Hr HPV neg,   12/2017 abnormal per patient in [place] History of abnormal Pap:  Yes: 2/17 ASCUS, +HPV 3/17 Colpo, CIN I 3/18 ASC-H, +HPV 4/18 Colpo no dysplasia. Co testing recommended in 12 and 24 months. 5/19 Negative pap, negative hpv MMG:  04/21/20 bi-rads 1 neg, scheduled 08/14/21  BMD:   none  Colonoscopy: 2015 had polyps. Colonoscopy being scheduled.  TDaP: UTD Gardasil: n/a   reports that she has never smoked. She has never used smokeless tobacco. She reports current alcohol use. She reports that she does not use drugs. 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 Married x 2.5 years. Kids are 1, 23 and 21. They all live in [place].   Past Medical History:  Diagnosis Date   Amenorrhea    Arthritis    Asthma    Colon polyps    COVID-19 long hauler    Depression    Frequent headaches    Frequent refractory urinary tract infections    Hay fever    History of chicken pox    Superficial thrombophlebitis     Post partum   Thyroid disease     Past Surgical History:  Procedure Laterality Date   ADENOIDECTOMY  1975   BREAST BIOPSY  1995   BREAST EXCISIONAL BIOPSY     CERVICAL DISCECTOMY  2003   CHOLECYSTECTOMY  2013   COLPOSCOPY     ENDOMETRIAL ABLATION     THYROIDECTOMY  2018   TUBAL LIGATION      Current Outpatient Medications  Medication Sig Dispense Refill   albuterol (VENTOLIN HFA) 108 (90 Base) MCG/ACT inhaler Inhale 2 puffs into the lungs every 4 (four) hours as needed for shortness of breath. 8 g 2   ARMOUR THYROID 120 MG tablet TAKE 1 TABLET (120 MG TOTAL) BY MOUTH DAILY. 90 tablet 0   buPROPion (WELLBUTRIN XL) 300 MG 24 hr tablet TAKE 1 TABLET BY MOUTH EVERY DAY 90 tablet 1   cyclobenzaprine (FLEXERIL) 10 MG tablet TAKE 1 TABLET BY MOUTH THREE TIMES A DAY AS NEEDED FOR MUSCLE SPASMS 30 tablet 0   estradiol (VIVELLE-DOT) 0.05 MG/24HR patch Place 1 patch (0.05 mg total) onto the skin 2 (two) times a week. 24 patch 1   Magnesium 100 MG CAPS      Multiple Vitamin (MULTIVITAMIN) tablet Take 1 tablet by mouth daily.  NALTREXONE HCL PO Take by mouth.     progesterone (PROMETRIUM) 100 MG capsule Take 1 capsule (100 mg total) by mouth daily. 90 capsule 1   SUMAtriptan (IMITREX) 100 MG tablet Take 1 tablet (100 mg total) by mouth daily as needed. 10 tablet 0   topiramate (TOPAMAX) 50 MG tablet Take 50 mg by mouth 2 (two) times daily.     traZODone (DESYREL) 50 MG tablet Take 1 tablet (50 mg total) by mouth at bedtime. 90 tablet 3   No current facility-administered medications for this visit.    Family History  Problem Relation Age of Onset   Depression Mother    Pancreatitis Mother    Alcohol abuse Father    Depression Father    Diabetes Father    Early death Father    Heart attack Father    Heart disease Father    Pancreatitis Father    Depression Sister    Hyperlipidemia Sister    Mental illness Sister    Mental illness Daughter    Migraines Daughter    Depression  Maternal Grandmother    Early death Maternal Grandmother    Hyperlipidemia Maternal Grandmother    Hypertension Maternal Grandmother    Stroke Maternal Grandmother    Learning disabilities Maternal Grandmother    Depression Maternal Grandfather    Early death Maternal Grandfather    Alcohol abuse Maternal Grandfather    Learning disabilities Maternal Grandfather    Epilepsy Maternal Grandfather    Suicidality Maternal Grandfather    Arthritis Paternal Grandmother    Cancer Paternal Grandmother    Hearing loss Paternal Grandmother    Breast cancer Paternal Grandmother    Alcohol abuse Paternal Grandfather    Hearing loss Paternal Grandfather    Heart disease Paternal Grandfather    Depression Sister    Depression Sister     Review of Systems  All other systems reviewed and are negative.  Exam:   BP 114/74    Pulse 86    Ht 5' 8"  (1.727 m)    Wt 143 lb (64.9 kg)    LMP 07/12/2011    SpO2 99%    BMI 21.74 kg/m   Weight change: @WEIGHTCHANGE @ Height:   Height: 5' 8"  (172.7 cm)  Ht Readings from Last 3 Encounters:  07/20/21 5' 8"  (1.727 m)  06/15/21 5' 9"  (1.753 m)  04/19/21 5' 9"  (1.753 m)    General appearance: alert, cooperative and appears stated age Head: Normocephalic, without obvious abnormality, atraumatic Neck: no adenopathy, supple, symmetrical, trachea midline and thyroid normal to inspection and palpation Lungs: clear to auscultation bilaterally Cardiovascular: regular rate and rhythm Breasts: normal appearance, no masses or tenderness Abdomen: soft, non-tender; non distended,  no masses,  no organomegaly Extremities: extremities normal, atraumatic, no cyanosis or edema Skin: Skin color, texture, turgor normal. No rashes or lesions Lymph nodes: Cervical, supraclavicular, and axillary nodes normal. No abnormal inguinal nodes palpated Neurologic: Grossly normal   Pelvic: External genitalia:  no lesions              Urethra:  normal appearing urethra with no  masses, tenderness or lesions              Bartholins and Skenes: normal                 Vagina: normal appearing vagina with normal color and discharge, no lesions              Cervix: no lesions  Bimanual Exam:  Uterus:  normal size, contour, position, consistency, mobility, non-tender              Adnexa: no mass, fullness, tenderness               Rectovaginal: Confirms               Anus:  normal sphincter tone, no lesions  Caryn Bee, CMA chaperoned for the exam.  1. Well woman exam Discussed breast self exam Discussed calcium and vit D intake Mammogram and colonoscopy are UTD  2. 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   4. Hormone replacement therapy (HRT) Aware of the risks, wants to continue - progesterone (PROMETRIUM) 100 MG capsule; Take 1 capsule (100 mg total) by mouth daily.  Dispense: 90 capsule; Refill: 3 - estradiol (VIVELLE-DOT) 0.05 MG/24HR patch; Place 1 patch (0.05 mg total) onto the skin 2 (two) times a week.  Dispense: 24 patch; Refill: 3  5. Screening for cervical cancer - Cytology - PAP

## 2021-07-20 ENCOUNTER — Other Ambulatory Visit: Payer: Self-pay

## 2021-07-20 ENCOUNTER — Other Ambulatory Visit (HOSPITAL_COMMUNITY)
Admission: RE | Admit: 2021-07-20 | Discharge: 2021-07-20 | Disposition: A | Discharge status: Home / Self Care | Payer: BC Managed Care – PPO | Source: Ambulatory Visit | Attending: Obstetrics and Gynecology | Admitting: Obstetrics and Gynecology

## 2021-07-20 ENCOUNTER — Ambulatory Visit (INDEPENDENT_AMBULATORY_CARE_PROVIDER_SITE_OTHER): Payer: BC Managed Care – PPO | Admitting: Obstetrics and Gynecology

## 2021-07-20 ENCOUNTER — Encounter: Payer: Self-pay | Admitting: Obstetrics and Gynecology

## 2021-07-20 VITALS — BP 114/74 | HR 86 | Ht 68.0 in | Wt 143.0 lb

## 2021-07-20 DIAGNOSIS — F5231 Female orgasmic disorder: Secondary | ICD-10-CM | POA: Diagnosis not present

## 2021-07-20 DIAGNOSIS — Z01419 Encounter for gynecological examination (general) (routine) without abnormal findings: Secondary | ICD-10-CM

## 2021-07-20 DIAGNOSIS — Z124 Encounter for screening for malignant neoplasm of cervix: Secondary | ICD-10-CM

## 2021-07-20 DIAGNOSIS — R6882 Decreased libido: Secondary | ICD-10-CM

## 2021-07-20 DIAGNOSIS — E890 Postprocedural hypothyroidism: Secondary | ICD-10-CM | POA: Insufficient documentation

## 2021-07-20 DIAGNOSIS — Z79890 Hormone replacement therapy (postmenopausal): Secondary | ICD-10-CM | POA: Diagnosis not present

## 2021-07-20 DIAGNOSIS — Z6825 Body mass index (BMI) 25.0-25.9, adult: Secondary | ICD-10-CM | POA: Insufficient documentation

## 2021-07-20 DIAGNOSIS — Z780 Asymptomatic menopausal state: Secondary | ICD-10-CM | POA: Insufficient documentation

## 2021-07-20 DIAGNOSIS — E782 Mixed hyperlipidemia: Secondary | ICD-10-CM | POA: Insufficient documentation

## 2021-07-20 DIAGNOSIS — E559 Vitamin D deficiency, unspecified: Secondary | ICD-10-CM | POA: Insufficient documentation

## 2021-07-20 MED ORDER — ESTRADIOL 0.05 MG/24HR TD PTTW: 1.0000 | MEDICATED_PATCH | TRANSDERMAL | 3 refills | Status: DC

## 2021-07-20 MED ORDER — PROGESTERONE MICRONIZED 100 MG PO CAPS: 100.0000 mg | ORAL_CAPSULE | Freq: Every day | ORAL | 3 refills | Status: DC

## 2021-07-20 NOTE — Patient Instructions (Signed)

## 2021-07-21 ENCOUNTER — Institutional Professional Consult (permissible substitution): Payer: BC Managed Care – PPO | Admitting: Pulmonary Disease

## 2021-07-21 LAB — CYTOLOGY - PAP
Comment: NEGATIVE
Diagnosis: NEGATIVE
High risk HPV: NEGATIVE

## 2021-07-24 ENCOUNTER — Encounter: Payer: Self-pay | Admitting: Obstetrics and Gynecology

## 2021-07-26 ENCOUNTER — Telehealth: Payer: BC Managed Care – PPO | Admitting: Physician Assistant

## 2021-07-26 DIAGNOSIS — N3090 Cystitis, unspecified without hematuria: Secondary | ICD-10-CM | POA: Diagnosis not present

## 2021-07-26 MED ORDER — CEPHALEXIN 500 MG PO CAPS: 500.0000 mg | ORAL_CAPSULE | Freq: Two times a day (BID) | ORAL | 0 refills | Status: AC

## 2021-07-26 NOTE — Progress Notes (Signed)
Ms. isel, skufca are scheduled for a virtual visit with your provider today.    Just as we do with appointments in the office, we must obtain your consent to participate.  Your consent will be active for this visit and any virtual visit you may have with one of our providers in the next 365 days.    If you have a MyChart account, I can also send a copy of this consent to you electronically.  All virtual visits are billed to your insurance company just like a traditional visit in the office.  As this is a virtual visit, video technology does not allow for your provider to perform a traditional examination.  This may limit your provider's ability to fully assess your condition.  If your provider identifies any concerns that need to be evaluated in person or the need to arrange testing such as labs, EKG, etc, we will make arrangements to do so.    Although advances in technology are sophisticated, we cannot ensure that it will always work on either your end or our end.  If the connection with a video visit is poor, we may have to switch to a telephone visit.  With either a video or telephone visit, we are not always able to ensure that we have a secure connection.   I need to obtain your verbal consent now.   Are you willing to proceed with your visit today?   Carrie [name] has provided verbal consent on 07/26/2021 for a virtual visit (video or telephone).   Rodney Booze, PA-C 07/26/2021  8:31 AM   Date:  07/26/2021   ID:  Carrie [name], DOB August 05, 1970, MRN [id number]  Patient Location: Home Provider Location: Home Office   Participants: Patient and Provider for Visit and Wrap up  Method of visit: Video  Location of Patient: Home Location of Provider: Home Office Consent was obtain for visit over the video. Services rendered by provider: Visit was performed via video  A video enabled telemedicine application was used and I verified that I am speaking with the correct person using  two identifiers.  PCP:  Carrie Barrack, MD   Chief Complaint:  dysuria  History of Present Illness:    Carrie [name] is a 51 y.o. female with history as stated below. Presents video telehealth for an acute care visit  Pt reports dysuria since yesterday. She has a h/o UTIs in the past and this feels the same. She tried taking OTC Azo however this AM sxs persisted. She further reports frequency, urgency. Denies fevers, chills, NV, flank/abd pain.   Past Medical, Surgical, Social History, Allergies, and Medications have been Reviewed.  Past Medical History:  Diagnosis Date   Amenorrhea    Arthritis    Asthma    Colon polyps    COVID-19 long hauler    Depression    Frequent headaches    Frequent refractory urinary tract infections    Hay fever    History of chicken pox    Superficial thrombophlebitis    Post partum   Thyroid disease     No outpatient medications have been marked as taking for the 07/26/21 encounter (Appointment) with Tivis Ringer, Jonthan Leite S, PA-C.     Allergies:   Gluten meal, Oseltamivir, Other, Tamiflu [oseltamivir phosphate], and Zofran [ondansetron]   ROS See HPI for history of present illness.  Physical Exam Vitals and nursing note reviewed.  Constitutional:      General: She is not in acute distress.  Appearance: She is well-developed.  HENT:     Head: Normocephalic and atraumatic.  Eyes:     Conjunctiva/sclera: Conjunctivae normal.  Pulmonary:     Effort: Pulmonary effort is normal.  Musculoskeletal:     Cervical back: Neck supple.  Neurological:     Mental Status: She is alert.           MDM: Dysuria and urgency x24 hours. Feels like prior UTIs. No sxs to suggest pyelonephritis. Rx for keflex given. PCP f/u and return precautions discussed.   There are no diagnoses linked to this encounter.   Time:   Today, I have spent 10 minutes with the patient with telehealth technology discussing the above problems, reviewing the chart, previous  notes, medications and orders.    Tests Ordered: No orders of the defined types were placed in this encounter.   Medication Changes: No orders of the defined types were placed in this encounter.    Disposition:  Follow up  Signed, Rodney Booze, PA-C  07/26/2021 8:31 AM

## 2021-07-26 NOTE — Patient Instructions (Signed)
°  Aybree Show, thank you for joining [facility], PA-C for today's virtual visit.  While this provider is not your primary care provider (PCP), if your PCP is located in our provider database this encounter information will be shared with them immediately following your visit.  Consent: (Patient) Ruby Cola provided verbal consent for this virtual visit at the beginning of the encounter.  Current Medications:  Current Outpatient Medications:    albuterol (VENTOLIN HFA) 108 (90 Base) MCG/ACT inhaler, Inhale 2 puffs into the lungs every 4 (four) hours as needed for shortness of breath., Disp: 8 g, Rfl: 2   ARMOUR THYROID 120 MG tablet, TAKE 1 TABLET (120 MG TOTAL) BY MOUTH DAILY., Disp: 90 tablet, Rfl: 0   buPROPion (WELLBUTRIN XL) 300 MG 24 hr tablet, TAKE 1 TABLET BY MOUTH EVERY DAY, Disp: 90 tablet, Rfl: 1   cyclobenzaprine (FLEXERIL) 10 MG tablet, TAKE 1 TABLET BY MOUTH THREE TIMES A DAY AS NEEDED FOR MUSCLE SPASMS, Disp: 30 tablet, Rfl: 0   estradiol (VIVELLE-DOT) 0.05 MG/24HR patch, Place 1 patch (0.05 mg total) onto the skin 2 (two) times a week., Disp: 24 patch, Rfl: 3   Magnesium 100 MG CAPS, , Disp: , Rfl:    Multiple Vitamin (MULTIVITAMIN) tablet, Take 1 tablet by mouth daily., Disp: , Rfl:    NALTREXONE HCL PO, Take by mouth., Disp: , Rfl:    progesterone (PROMETRIUM) 100 MG capsule, Take 1 capsule (100 mg total) by mouth daily., Disp: 90 capsule, Rfl: 3   SUMAtriptan (IMITREX) 100 MG tablet, Take 1 tablet (100 mg total) by mouth daily as needed., Disp: 10 tablet, Rfl: 0   topiramate (TOPAMAX) 50 MG tablet, Take 50 mg by mouth 2 (two) times daily., Disp: , Rfl:    traZODone (DESYREL) 50 MG tablet, Take 1 tablet (50 mg total) by mouth at bedtime., Disp: 90 tablet, Rfl: 3   Medications ordered in this encounter:  No orders of the defined types were placed in this encounter.    *If you need refills on other medications prior to your next appointment, please contact  your pharmacy*  Follow-Up: Call back or seek an in-person evaluation if the symptoms worsen or if the condition fails to improve as anticipated.  Other Instructions You were given a prescription for antibiotics. Please take the antibiotic prescription fully.   Follow up with your regular doctor in 1 week for reassessment and seek care sooner if your symptoms worsen or fail to improve.    If you have been instructed to have an in-person evaluation today at a local Urgent Care facility, please use the link below. It will take you to a list of all of our available [facility], including address, phone number and hours of operation. Please do not delay care.  [facility]  If you or a family member do not have a primary care provider, use the link below to schedule a visit and establish care. When you choose a [facility] primary care physician or advanced practice provider, you gain a long-term partner in health. Find a Primary Care Provider  Learn more about [facility] in-office and virtual care options: [facility]

## 2021-07-27 ENCOUNTER — Other Ambulatory Visit: Payer: Self-pay | Admitting: Obstetrics and Gynecology

## 2021-07-27 ENCOUNTER — Other Ambulatory Visit: Payer: Self-pay | Admitting: *Deleted

## 2021-07-27 DIAGNOSIS — R6882 Decreased libido: Secondary | ICD-10-CM

## 2021-07-27 DIAGNOSIS — F5231 Female orgasmic disorder: Secondary | ICD-10-CM

## 2021-07-27 LAB — TESTOS,TOTAL,FREE AND SHBG (FEMALE)
Free Testosterone: 1.5 pg/mL (ref 0.1–6.4)
Sex Hormone Binding: 90 nmol/L (ref 17–124)
Testosterone, Total, LC-MS-MS: 20 ng/dL (ref 2–45)

## 2021-07-27 MED ORDER — NONFORMULARY OR COMPOUNDED ITEM: 0 refills | Status: DC

## 2021-07-28 ENCOUNTER — Encounter: Payer: Self-pay | Admitting: Internal Medicine

## 2021-07-28 ENCOUNTER — Ambulatory Visit (INDEPENDENT_AMBULATORY_CARE_PROVIDER_SITE_OTHER): Payer: BC Managed Care – PPO | Admitting: Internal Medicine

## 2021-07-28 VITALS — BP 98/60 | HR 85 | Ht 69.0 in | Wt 146.0 lb

## 2021-07-28 DIAGNOSIS — K5900 Constipation, unspecified: Secondary | ICD-10-CM | POA: Diagnosis not present

## 2021-07-28 DIAGNOSIS — R634 Abnormal weight loss: Secondary | ICD-10-CM

## 2021-07-28 DIAGNOSIS — R195 Other fecal abnormalities: Secondary | ICD-10-CM | POA: Diagnosis not present

## 2021-07-28 DIAGNOSIS — R1310 Dysphagia, unspecified: Secondary | ICD-10-CM | POA: Diagnosis not present

## 2021-07-28 NOTE — Progress Notes (Signed)
Chief Complaint: Positive Cologuard, weight loss  HPI : 51 year old female with history of colon polyps, hypothyroidism presents with positive Cologuard and weight loss  Patient has had weight loss of 30 lbs that has been unintentional over the last 6 months. The onset of her weight loss seemed to correlate with the start of COVID, and she has had longstanding symptoms of COVID that have led to decreased appetite. Endorses pills getting stuck in her throat. She was told by her ENT physician that she may be having issues with reflux. Endorses some issues with nausea. Has issues with constipation. She has been taking low dose naltrexone that has helped with some of her long COVID symptoms. Denies blood in stools. She has had constipation issues at a baseline. She has one BM once every 3-4 days, and she will need assistance to have a BM. She eats a lot of fiber and drinks a lot of water. She tries to take prune pills or suppositories to help with her constipation issues. Has some lower ab pain that gets better after she has a BM. Patient last had a colonoscopy about 10 years ago that showed colon polyps. That colonoscopy was done in [place], [place].   Past Medical History:  Diagnosis Date   Amenorrhea    Arthritis    Asthma    Colon polyps    COVID-19 long hauler    Depression    Frequent headaches    Frequent refractory urinary tract infections    Hay fever    History of chicken pox    Superficial thrombophlebitis    Post partum   Thyroid disease    Past Surgical History:  Procedure Laterality Date   ADENOIDECTOMY  1975   BREAST BIOPSY  1995   BREAST EXCISIONAL BIOPSY     CERVICAL DISCECTOMY  2003   CHOLECYSTECTOMY  2013   COLPOSCOPY     ENDOMETRIAL ABLATION     THYROIDECTOMY  2018   TUBAL LIGATION     Family History  Problem Relation Age of Onset   Depression Mother    Pancreatitis Mother    Alcohol abuse Father    Depression Father    Diabetes Father    Early death Father     Heart attack Father    Heart disease Father    Pancreatitis Father    Depression Sister    Hyperlipidemia Sister    Mental illness Sister    Mental illness Daughter    Migraines Daughter    Depression Maternal Grandmother    Early death Maternal Grandmother    Hyperlipidemia Maternal Grandmother    Hypertension Maternal Grandmother    Stroke Maternal Grandmother    Learning disabilities Maternal Grandmother    Depression Maternal Grandfather    Early death Maternal Grandfather    Alcohol abuse Maternal Grandfather    Learning disabilities Maternal Grandfather    Epilepsy Maternal Grandfather    Suicidality Maternal Grandfather    Arthritis Paternal Grandmother    Cancer Paternal Grandmother    Hearing loss Paternal Grandmother    Breast cancer Paternal Grandmother    Alcohol abuse Paternal Grandfather    Hearing loss Paternal Grandfather    Heart disease Paternal Grandfather    Depression Sister    Depression Sister    Social History   Tobacco Use   Smoking status: Never   Smokeless tobacco: Never  Vaping Use   Vaping Use: Never used  Substance Use Topics   Alcohol use: Yes  Comment: 0-2 drinks a month   Drug use: Never   Current Outpatient Medications  Medication Sig Dispense Refill   albuterol (VENTOLIN HFA) 108 (90 Base) MCG/ACT inhaler Inhale 2 puffs into the lungs every 4 (four) hours as needed for shortness of breath. 8 g 2   ARMOUR THYROID 120 MG tablet TAKE 1 TABLET (120 MG TOTAL) BY MOUTH DAILY. 90 tablet 0   buPROPion (WELLBUTRIN XL) 300 MG 24 hr tablet TAKE 1 TABLET BY MOUTH EVERY DAY 90 tablet 1   cephALEXin (KEFLEX) 500 MG capsule Take 1 capsule (500 mg total) by mouth 2 (two) times daily for 7 days. 14 capsule 0   cyclobenzaprine (FLEXERIL) 10 MG tablet TAKE 1 TABLET BY MOUTH THREE TIMES A DAY AS NEEDED FOR MUSCLE SPASMS 30 tablet 0   estradiol (VIVELLE-DOT) 0.05 MG/24HR patch Place 1 patch (0.05 mg total) onto the skin 2 (two) times a week. 24 patch 3    Magnesium 100 MG CAPS      Multiple Vitamin (MULTIVITAMIN) tablet Take 1 tablet by mouth daily.     NALTREXONE HCL PO Take by mouth.     NONFORMULARY OR COMPOUNDED ITEM Testosterone 1% cream apply 0.5 grams daily to her lower abdomen 30 each 0   progesterone (PROMETRIUM) 100 MG capsule Take 1 capsule (100 mg total) by mouth daily. 90 capsule 3   SUMAtriptan (IMITREX) 100 MG tablet Take 1 tablet (100 mg total) by mouth daily as needed. 10 tablet 0   topiramate (TOPAMAX) 50 MG tablet Take 50 mg by mouth 2 (two) times daily.     traZODone (DESYREL) 50 MG tablet Take 1 tablet (50 mg total) by mouth at bedtime. 90 tablet 3   No current facility-administered medications for this visit.   Allergies  Allergen Reactions   Gluten Meal    Oseltamivir Other (See Comments)    Severe jaw Pain   Other Other (See Comments)    Beef Causes stomach pain   Tamiflu [Oseltamivir Phosphate]    Zofran [Ondansetron]     rash   Review of Systems: All systems reviewed and negative except where noted in HPI.   Physical Exam: Ht 5' 9"  (1.753 m)    Wt 146 lb (66.2 kg)    LMP 07/12/2011    BMI 21.56 kg/m  Constitutional: Pleasant,well-developed, female in no acute distress. HEENT: Normocephalic and atraumatic. Conjunctivae are normal. No scleral icterus. Cardiovascular: Normal rate, regular rhythm.  Pulmonary/chest: Effort normal and breath sounds normal. No wheezing, rales or rhonchi. Abdominal: Soft, nondistended, nontender. Bowel sounds active throughout. There are no masses palpable. No hepatomegaly. Extremities: No edema Neurological: Alert and oriented to person place and time. Skin: Skin is warm and dry. No rashes noted. Psychiatric: Normal mood and affect. Behavior is normal.  Labs 04/2021: Positive Cologuard  Labs 06/2021: CMP nml, CBC nml. TSH nml.  ASSESSMENT AND PLAN: Positive Cologuard Weight loss Pill dysphagia Constipation Patient presents with weight loss over the last several  months as well as other recent positive Cologuard.  She also describes some pill dysphagia.  Thus it would be reasonable to proceed with EGD and colonoscopy to rule out underlying GI malignancy as a source of her symptoms.  Patient did have a colonoscopy she reports about 10 years ago but unclear exactly what kind of colon polyps (may have been benign) she had at that time. - Start daily Miralax to try to help with her constipation issues - EGD/colonoscopy [facility]  Christia Reading, MD

## 2021-07-28 NOTE — Patient Instructions (Signed)
If you are age 50 or older, your body mass index should be between 23-30. Your Body mass index is 21.56 kg/m. If this is out of the aforementioned range listed, please consider follow up with your Primary Care Provider.  If you are age 30 or younger, your body mass index should be between 19-25. Your Body mass index is 21.56 kg/m. If this is out of the aformentioned range listed, please consider follow up with your Primary Care Provider.   ________________________________________________________  The [facility] providers would like to encourage you to use [facility] to communicate with providers for non-urgent requests or questions.  Due to long hold times on the telephone, sending your provider a message by [facility] may be a faster and more efficient way to get a response.  Please allow 48 business hours for a response.  Please remember that this is for non-urgent requests.  _______________________________________________________  Carrie [name] have been scheduled for an endoscopy and colonoscopy. Please follow the written instructions given to you at your visit today. Please pick up your prep supplies at the pharmacy within the next 1-3 days. If you use inhalers (even only as needed), please bring them with you on the day of your procedure.

## 2021-07-29 ENCOUNTER — Ambulatory Visit
Admission: RE | Admit: 2021-07-29 | Discharge: 2021-07-29 | Disposition: A | Discharge status: Home / Self Care | Payer: BC Managed Care – PPO | Source: Ambulatory Visit | Attending: Obstetrics and Gynecology | Admitting: Obstetrics and Gynecology

## 2021-07-29 ENCOUNTER — Other Ambulatory Visit: Payer: Self-pay

## 2021-07-29 DIAGNOSIS — Z1231 Encounter for screening mammogram for malignant neoplasm of breast: Secondary | ICD-10-CM | POA: Diagnosis not present

## 2021-08-09 ENCOUNTER — Encounter: Payer: Self-pay | Admitting: Internal Medicine

## 2021-08-17 ENCOUNTER — Ambulatory Visit (INDEPENDENT_AMBULATORY_CARE_PROVIDER_SITE_OTHER): Payer: BC Managed Care – PPO

## 2021-08-17 ENCOUNTER — Other Ambulatory Visit: Payer: Self-pay

## 2021-08-17 ENCOUNTER — Encounter: Payer: Self-pay | Admitting: *Deleted

## 2021-08-17 DIAGNOSIS — E538 Deficiency of other specified B group vitamins: Secondary | ICD-10-CM

## 2021-08-17 MED ORDER — CYANOCOBALAMIN 1000 MCG/ML IJ SOLN
1000.0000 ug | Freq: Once | INTRAMUSCULAR | Status: AC
  Administered 2021-08-17: 1000 ug via INTRAMUSCULAR

## 2021-08-17 NOTE — Progress Notes (Signed)
Patient came in for B-12 injection given in in left deltoid IM. Patient tolerated well with no signs of distress.

## 2021-08-19 ENCOUNTER — Encounter: Payer: Self-pay | Admitting: Family Medicine

## 2021-08-20 MED ORDER — BUPROPION HCL ER (XL) 300 MG PO TB24: 300.0000 mg | ORAL_TABLET | Freq: Every day | ORAL | 1 refills | Status: DC

## 2021-08-23 ENCOUNTER — Telehealth: Payer: BC Managed Care – PPO | Admitting: Nurse Practitioner

## 2021-08-23 DIAGNOSIS — J0110 Acute frontal sinusitis, unspecified: Secondary | ICD-10-CM | POA: Diagnosis not present

## 2021-08-23 MED ORDER — AMOXICILLIN-POT CLAVULANATE 875-125 MG PO TABS: 1.0000 | ORAL_TABLET | Freq: Two times a day (BID) | ORAL | 0 refills | Status: AC

## 2021-08-23 NOTE — Progress Notes (Signed)
Virtual Visit Consent   Carrie [name], you are scheduled for a virtual visit with a [facility] provider today.     Just as with appointments in the office, your consent must be obtained to participate.  Your consent will be active for this visit and any virtual visit you may have with one of our providers in the next 365 days.     If you have a MyChart account, a copy of this consent can be sent to you electronically.  All virtual visits are billed to your insurance company just like a traditional visit in the office.    As this is a virtual visit, video technology does not allow for your provider to perform a traditional examination.  This may limit your provider's ability to fully assess your condition.  If your provider identifies any concerns that need to be evaluated in person or the need to arrange testing (such as labs, EKG, etc.), we will make arrangements to do so.     Although advances in technology are sophisticated, we cannot ensure that it will always work on either your end or our end.  If the connection with a video visit is poor, the visit may have to be switched to a telephone visit.  With either a video or telephone visit, we are not always able to ensure that we have a secure connection.     I need to obtain your verbal consent now.   Are you willing to proceed with your visit today?    Carrie [name] has provided verbal consent on 08/23/2021 for a virtual visit (video or telephone).   Carrie Schneiders, FNP   Date: 08/23/2021 5:26 PM   Virtual Visit via Video Note   I, Carrie [name], connected with  Carrie [name]  ([id number], 02-19-1971) on 08/23/21 at  5:30 PM EST by a video-enabled telemedicine application and verified that I am speaking with the correct person using two identifiers.  Location: Patient: Virtual Visit Location Patient: Home Provider: Virtual Visit Location Provider: Home Office   I discussed the limitations of evaluation and management by  telemedicine and the availability of in person appointments. The patient expressed understanding and agreed to proceed.    History of Present Illness: Carrie [name] is a 51 y.o. who identifies as a female who was assigned female at birth, and is being seen today with complaints of sinus congestion for the past week. She has been using sudafed OTC and over the past 4 days she has had more sinus pain and symptoms have continued to worsen. She feels the pressure and pain is now triggering a migraine HA.   She has been trying steam for relief as well as advil for pain relief.   She denies a fever  She has not taken a COVID test   Problems:  Patient Active Problem List   Diagnosis Date Noted   Body mass index (BMI) 25.0-25.9, adult 07/20/2021   History of thyroidectomy 07/20/2021   Menopause 07/20/2021   Mixed hyperlipidemia 07/20/2021   Vitamin D deficiency 07/20/2021   Insomnia 02/23/2021   Dyslipidemia 07/15/2019   Left hip pain 07/10/2019   Acetabular labrum tear 05/20/2019   Constipation 09/03/2018   Varicose veins of both lower extremities with pain 04/20/2018   Migraines 04/13/2018   Hypothyroidism 04/13/2018   Major depression in remission ([place]) 04/13/2018   History of asthma 04/13/2018   Hypothyroidism due to Hashimoto's thyroiditis 02/03/2017   Thyroid nodule 02/03/2017   Neoplasm of uncertain behavior of  thyroid gland 01/15/2017   Migraine without aura and without status migrainosus, not intractable 10/10/2016   Mild intermittent asthma without complication 44/81/8563   Encounter for sterilization 07/09/2015   Punctate keratitis 01/09/2015   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   Asthma 07/03/2014   Cervico-occipital neuralgia 07/03/2014   Depression with anxiety 07/03/2014   Intention tremor 07/03/2014   Anxiety 11/14/2012   H/O cervical discectomy 11/14/2012   Acute embolism and thrombosis of other specified veins 10/16/1998   Fibrocystic disease of breast  10/16/1998    Allergies:  Allergies  Allergen Reactions   Gluten Meal    Oseltamivir Other (See Comments)    Severe jaw Pain   Other Other (See Comments)    Beef Causes stomach pain   Tamiflu [Oseltamivir Phosphate]    Zofran [Ondansetron]     rash   Medications:  Current Outpatient Medications:    albuterol (VENTOLIN HFA) 108 (90 Base) MCG/ACT inhaler, Inhale 2 puffs into the lungs every 4 (four) hours as needed for shortness of breath., Disp: 8 g, Rfl: 2   ARMOUR THYROID 120 MG tablet, TAKE 1 TABLET (120 MG TOTAL) BY MOUTH DAILY., Disp: 90 tablet, Rfl: 0   buPROPion (WELLBUTRIN XL) 300 MG 24 hr tablet, Take 1 tablet (300 mg total) by mouth daily., Disp: 90 tablet, Rfl: 1   cyclobenzaprine (FLEXERIL) 10 MG tablet, TAKE 1 TABLET BY MOUTH THREE TIMES A DAY AS NEEDED FOR MUSCLE SPASMS, Disp: 30 tablet, Rfl: 0   estradiol (VIVELLE-DOT) 0.05 MG/24HR patch, Place 1 patch (0.05 mg total) onto the skin 2 (two) times a week., Disp: 24 patch, Rfl: 3   Magnesium 100 MG CAPS, , Disp: , Rfl:    Multiple Vitamin (MULTIVITAMIN) tablet, Take 1 tablet by mouth daily., Disp: , Rfl:    NALTREXONE HCL PO, Take by mouth., Disp: , Rfl:    NONFORMULARY OR COMPOUNDED ITEM, Testosterone 1% cream apply 0.5 grams daily to her lower abdomen, Disp: 30 each, Rfl: 0   progesterone (PROMETRIUM) 100 MG capsule, Take 1 capsule (100 mg total) by mouth daily., Disp: 90 capsule, Rfl: 3   SUMAtriptan (IMITREX) 100 MG tablet, Take 1 tablet (100 mg total) by mouth daily as needed., Disp: 10 tablet, Rfl: 0   topiramate (TOPAMAX) 50 MG tablet, Take 50 mg by mouth 2 (two) times daily., Disp: , Rfl:    traZODone (DESYREL) 50 MG tablet, Take 1 tablet (50 mg total) by mouth at bedtime., Disp: 90 tablet, Rfl: 3  Observations/Objective: Patient is well-developed, well-nourished in no acute distress.  Resting comfortably  at home.  Head is normocephalic, atraumatic.  No labored breathing.  Speech is clear and coherent with  logical content.  Patient is alert and oriented at baseline.    Assessment and Plan: 1. Acute non-recurrent frontal sinusitis  - amoxicillin-clavulanate (AUGMENTIN) 875-125 MG tablet; Take 1 tablet by mouth 2 (two) times daily for 7 days. Take with food  Dispense: 14 tablet; Refill: 0    Continue advil for headache and Sudafed for congestion relief.   Follow Up Instructions: I discussed the assessment and treatment plan with the patient. The patient was provided an opportunity to ask questions and all were answered. The patient agreed with the plan and demonstrated an understanding of the instructions.  A copy of instructions were sent to the patient via MyChart unless otherwise noted below.    The patient was advised to call back or seek an in-person evaluation if the symptoms worsen or if the condition  fails to improve as anticipated.  Time:  I spent 10 minutes with the patient via telehealth technology discussing the above problems/concerns.    Carrie Schneiders, FNP

## 2021-08-25 ENCOUNTER — Other Ambulatory Visit: Payer: BC Managed Care – PPO

## 2021-08-25 ENCOUNTER — Other Ambulatory Visit: Payer: Self-pay

## 2021-08-25 DIAGNOSIS — R6882 Decreased libido: Secondary | ICD-10-CM

## 2021-08-25 DIAGNOSIS — F5231 Female orgasmic disorder: Secondary | ICD-10-CM

## 2021-08-31 LAB — TESTOS,TOTAL,FREE AND SHBG (FEMALE)
Free Testosterone: 2 pg/mL (ref 0.1–6.4)
Sex Hormone Binding: 84 nmol/L (ref 17–124)
Testosterone, Total, LC-MS-MS: 26 ng/dL (ref 2–45)

## 2021-09-02 ENCOUNTER — Other Ambulatory Visit: Payer: Self-pay

## 2021-09-02 ENCOUNTER — Ambulatory Visit: Payer: BC Managed Care – PPO | Admitting: Rheumatology

## 2021-09-02 NOTE — Telephone Encounter (Signed)
AEX 07/20/21.

## 2021-09-03 ENCOUNTER — Encounter: Payer: Self-pay | Admitting: Internal Medicine

## 2021-09-06 ENCOUNTER — Other Ambulatory Visit: Payer: Self-pay

## 2021-09-06 ENCOUNTER — Encounter: Payer: Self-pay | Admitting: Obstetrics and Gynecology

## 2021-09-06 DIAGNOSIS — F5231 Female orgasmic disorder: Secondary | ICD-10-CM

## 2021-09-06 DIAGNOSIS — R6882 Decreased libido: Secondary | ICD-10-CM

## 2021-09-06 MED ORDER — NONFORMULARY OR COMPOUNDED ITEM: 5 refills | Status: DC

## 2021-09-09 ENCOUNTER — Other Ambulatory Visit: Payer: Self-pay

## 2021-09-09 ENCOUNTER — Encounter: Payer: Self-pay | Admitting: Family Medicine

## 2021-09-09 ENCOUNTER — Ambulatory Visit (AMBULATORY_SURGERY_CENTER): Payer: BC Managed Care – PPO | Admitting: Internal Medicine

## 2021-09-09 ENCOUNTER — Telehealth: Payer: Self-pay | Admitting: Gastroenterology

## 2021-09-09 ENCOUNTER — Encounter: Payer: Self-pay | Admitting: Internal Medicine

## 2021-09-09 VITALS — BP 124/74 | HR 72 | Temp 97.9°F | Resp 18 | Ht 69.0 in | Wt 146.0 lb

## 2021-09-09 DIAGNOSIS — R1310 Dysphagia, unspecified: Secondary | ICD-10-CM

## 2021-09-09 DIAGNOSIS — R634 Abnormal weight loss: Secondary | ICD-10-CM

## 2021-09-09 DIAGNOSIS — K2970 Gastritis, unspecified, without bleeding: Secondary | ICD-10-CM | POA: Diagnosis not present

## 2021-09-09 DIAGNOSIS — K5730 Diverticulosis of large intestine without perforation or abscess without bleeding: Secondary | ICD-10-CM | POA: Diagnosis not present

## 2021-09-09 DIAGNOSIS — R195 Other fecal abnormalities: Secondary | ICD-10-CM | POA: Diagnosis present

## 2021-09-09 DIAGNOSIS — K648 Other hemorrhoids: Secondary | ICD-10-CM | POA: Diagnosis not present

## 2021-09-09 DIAGNOSIS — D132 Benign neoplasm of duodenum: Secondary | ICD-10-CM

## 2021-09-09 DIAGNOSIS — K31A0 Gastric intestinal metaplasia, unspecified: Secondary | ICD-10-CM | POA: Diagnosis not present

## 2021-09-09 MED ORDER — OMEPRAZOLE 40 MG PO CPDR: 40.0000 mg | DELAYED_RELEASE_CAPSULE | Freq: Two times a day (BID) | ORAL | 1 refills | Status: DC

## 2021-09-09 MED ORDER — SODIUM CHLORIDE 0.9 % IV SOLN: 500.0000 mL | Freq: Once | INTRAVENOUS | Status: DC

## 2021-09-09 NOTE — Op Note (Signed)
[place] ?Patient Name: Carrie [name] ?Procedure Date: 09/09/2021 9:14 AM ?MRN: [id number] ?Endoscopist: Sonny Masters "Christia Reading ,  ?Age: 51 ?Referring MD:  ?Date of Birth: Sep 05, 1970 ?Gender: Female ?Account #: [id number] ?Procedure:                Colonoscopy ?Indications:              Positive Cologuard test ?Medicines:                Monitored Anesthesia Care ?Procedure:                Pre-Anesthesia Assessment: ?                          - Prior to the procedure, a History and Physical  ?                          was performed, and patient medications and  ?                          allergies were reviewed. The patient's tolerance of  ?                          previous anesthesia was also reviewed. The risks  ?                          and benefits of the procedure and the sedation  ?                          options and risks were discussed with the patient.  ?                          All questions were answered, and informed consent  ?                          was obtained. Prior Anticoagulants: The patient has  ?                          taken no previous anticoagulant or antiplatelet  ?                          agents. ASA Grade Assessment: II - A patient with  ?                          mild systemic disease. After reviewing the risks  ?                          and benefits, the patient was deemed in  ?                          satisfactory condition to undergo the procedure. ?                          After obtaining informed consent, the colonoscope  ?  was passed under direct vision. Throughout the  ?                          procedure, the patient's blood pressure, pulse, and  ?                          oxygen saturations were monitored continuously. The  ?                          Olympus CF-[id number] (#[id number]) Colonoscope was  ?                          introduced through the anus and advanced to the the  ?                          terminal ileum. The colonoscopy  was performed  ?                          without difficulty. The patient tolerated the  ?                          procedure well. The quality of the bowel  ?                          preparation was good. The terminal ileum, ileocecal  ?                          valve, appendiceal orifice, and rectum were  ?                          photographed. ?Scope In: 9:37:23 AM ?Scope Out: 10:00:33 AM ?Scope Withdrawal Time: 0 hours 13 minutes 26 seconds  ?Total Procedure Duration: 0 hours 23 minutes 10 seconds  ?Findings:                 The terminal ileum appeared normal. ?                          Multiple diverticula were found in the sigmoid  ?                          colon. ?                          Non-bleeding internal hemorrhoids were found during  ?                          retroflexion. ?Complications:            No immediate complications. ?Estimated Blood Loss:     Estimated blood loss was minimal. ?Impression:               - The examined portion of the ileum was normal. ?                          - Diverticulosis in the sigmoid colon. ?                          -  Non-bleeding internal hemorrhoids. ?                          - No specimens collected. ?Recommendation:           - Discharge patient to home (with escort). ?                          - Repeat colonoscopy in 10 years for screening  ?                          purposes. ?                          - The findings and recommendations were discussed  ?                          with the patient. ?                          - Return to GI clinic in 2 months. ?Georgian Co,  ?09/09/2021 10:11:13 AM ?

## 2021-09-09 NOTE — Progress Notes (Signed)
? ?GASTROENTEROLOGY PROCEDURE H&P NOTE  ? ?Primary Care Physician: ?Vivi Barrack, MD ? ? ? ?Reason for Procedure:   Positive Cologuard, weight loss ? ?Plan:    EGD/colonoscopy ? ?Patient is appropriate for endoscopic procedure(s) in the ambulatory ([facility]) setting. ? ?The nature of the procedure, as well as the risks, benefits, and alternatives were carefully and thoroughly reviewed with the patient. Ample time for discussion and questions allowed. The patient understood, was satisfied, and agreed to proceed.  ? ? ? ?HPI: ?Carrie [name] is a 51 y.o. female who presents for EGD/colonoscopy for evaluation of positive Cologuard and weight loss .  Patient was most recently seen in the Gastroenterology Clinic on 07/28/21.  No interval change in medical history since that appointment. Please refer to that note for full details regarding GI history and clinical presentation.  ? ?Past Medical History:  ?Diagnosis Date  ? Amenorrhea   ? Arthritis   ? Asthma   ? Colon polyps   ? COVID-19 long hauler   ? Depression   ? Frequent headaches   ? Frequent refractory urinary tract infections   ? Hay fever   ? History of chicken pox   ? Superficial thrombophlebitis   ? Post partum  ? Thyroid disease   ? ? ?Past Surgical History:  ?Procedure Laterality Date  ? ADENOIDECTOMY  1975  ? BREAST BIOPSY  1995  ? BREAST EXCISIONAL BIOPSY    ? CERVICAL DISCECTOMY  2003  ? CHOLECYSTECTOMY  2013  ? COLPOSCOPY    ? ENDOMETRIAL ABLATION    ? THYROIDECTOMY  2018  ? TUBAL LIGATION    ? ? ?Prior to Admission medications   ?Medication Sig Start Date End Date Taking? Authorizing Provider  ?ARMOUR THYROID 120 MG tablet TAKE 1 TABLET (120 MG TOTAL) BY MOUTH DAILY. 05/01/20  Yes Vivi Barrack, MD  ?buPROPion (WELLBUTRIN XL) 300 MG 24 hr tablet Take 1 tablet (300 mg total) by mouth daily. 08/20/21  Yes Vivi Barrack, MD  ?estradiol (VIVELLE-DOT) 0.05 MG/24HR patch Place 1 patch (0.05 mg total) onto the skin 2 (two) times a week. 07/22/21 03/31/22 Yes  Salvadore Dom, MD  ?NONFORMULARY OR COMPOUNDED ITEM Testosterone 1% cream apply 0.5 grams daily to her lower abdomen ?(Per pharmacy #15 is a 30 days supply) 09/06/21  Yes Salvadore Dom, MD  ?progesterone (PROMETRIUM) 100 MG capsule Take 1 capsule (100 mg total) by mouth daily. 07/20/21 04/16/22 Yes Salvadore Dom, MD  ?topiramate (TOPAMAX) 50 MG tablet Take 50 mg by mouth 2 (two) times daily. 01/07/21  Yes [provider]  ?albuterol (VENTOLIN HFA) 108 (90 Base) MCG/ACT inhaler Inhale 2 puffs into the lungs every 4 (four) hours as needed for shortness of breath. 01/18/19   [place], Elyse Jarvis, FNP  ?cyclobenzaprine (FLEXERIL) 10 MG tablet TAKE 1 TABLET BY MOUTH THREE TIMES A DAY AS NEEDED FOR MUSCLE SPASMS 12/21/20   Vivi Barrack, MD  ?Magnesium 100 MG CAPS     [provider]  ?Multiple Vitamin (MULTIVITAMIN) tablet Take 1 tablet by mouth daily.    [provider]  ?NALTREXONE HCL PO Take by mouth.    [provider]  ?SUMAtriptan (IMITREX) 100 MG tablet Take 1 tablet (100 mg total) by mouth daily as needed. 06/05/18   Vivi Barrack, MD  ?traZODone (DESYREL) 50 MG tablet Take 1 tablet (50 mg total) by mouth at bedtime. 02/23/21   Vivi Barrack, MD  ? ? ?Current Outpatient Medications  ?  Medication Sig Dispense Refill  ? ARMOUR THYROID 120 MG tablet TAKE 1 TABLET (120 MG TOTAL) BY MOUTH DAILY. 90 tablet 0  ? buPROPion (WELLBUTRIN XL) 300 MG 24 hr tablet Take 1 tablet (300 mg total) by mouth daily. 90 tablet 1  ? estradiol (VIVELLE-DOT) 0.05 MG/24HR patch Place 1 patch (0.05 mg total) onto the skin 2 (two) times a week. 24 patch 3  ? NONFORMULARY OR COMPOUNDED ITEM Testosterone 1% cream apply 0.5 grams daily to her lower abdomen ?(Per pharmacy #15 is a 30 days supply) 15 each 5  ? progesterone (PROMETRIUM) 100 MG capsule Take 1 capsule (100 mg total) by mouth daily. 90 capsule 3  ? topiramate (TOPAMAX) 50 MG tablet Take 50 mg by mouth 2 (two) times daily.    ?  albuterol (VENTOLIN HFA) 108 (90 Base) MCG/ACT inhaler Inhale 2 puffs into the lungs every 4 (four) hours as needed for shortness of breath. 8 g 2  ? cyclobenzaprine (FLEXERIL) 10 MG tablet TAKE 1 TABLET BY MOUTH THREE TIMES A DAY AS NEEDED FOR MUSCLE SPASMS 30 tablet 0  ? Magnesium 100 MG CAPS     ? Multiple Vitamin (MULTIVITAMIN) tablet Take 1 tablet by mouth daily.    ? NALTREXONE HCL PO Take by mouth.    ? SUMAtriptan (IMITREX) 100 MG tablet Take 1 tablet (100 mg total) by mouth daily as needed. 10 tablet 0  ? traZODone (DESYREL) 50 MG tablet Take 1 tablet (50 mg total) by mouth at bedtime. 90 tablet 3  ? ?Current Facility-Administered Medications  ?Medication Dose Route Frequency Provider Last Rate Last Admin  ? 0.9 %  sodium chloride infusion  500 mL Intravenous Once Sharyn Creamer, MD      ? ? ?Allergies as of 09/09/2021 - Review Complete 09/09/2021  ?Allergen Reaction Noted  ? Gluten meal  04/13/2018  ? Oseltamivir Other (See Comments) 09/30/2016  ? Other Other (See Comments) 08/07/2017  ? Tamiflu [oseltamivir phosphate]  04/13/2018  ? Zofran [ondansetron]  04/13/2018  ? ? ?Family History  ?Problem Relation Age of Onset  ? Depression Mother   ? Pancreatitis Mother   ? Alcohol abuse Father   ? Depression Father   ? Diabetes Father   ? Early death Father   ? Heart attack Father   ? Heart disease Father   ? Pancreatitis Father   ? Depression Sister   ? Hyperlipidemia Sister   ? Mental illness Sister   ? Depression Sister   ? Depression Sister   ? Depression Maternal Grandmother   ? Early death Maternal Grandmother   ? Hyperlipidemia Maternal Grandmother   ? Hypertension Maternal Grandmother   ? Stroke Maternal Grandmother   ? Learning disabilities Maternal Grandmother   ? Depression Maternal Grandfather   ? Early death Maternal Grandfather   ? Alcohol abuse Maternal Grandfather   ? Learning disabilities Maternal Grandfather   ? Epilepsy Maternal Grandfather   ? Suicidality Maternal Grandfather   ? Arthritis  Paternal Grandmother   ? Cancer Paternal Grandmother   ? Hearing loss Paternal Grandmother   ? Breast cancer Paternal Grandmother   ? Alcohol abuse Paternal Grandfather   ? Hearing loss Paternal Grandfather   ? Heart disease Paternal Grandfather   ? Mental illness Daughter   ? Migraines Daughter   ? Colon cancer Neg Hx   ? Esophageal cancer Neg Hx   ? Stomach cancer Neg Hx   ? Rectal cancer Neg Hx   ? ? ?Social  History  ? ?Socioeconomic History  ? Marital status: Married  ?  Spouse name: Not on file  ? Number of children: Not on file  ? Years of education: Not on file  ? Highest education level: Not on file  ?Occupational History  ? Not on file  ?Tobacco Use  ? Smoking status: Never  ? Smokeless tobacco: Never  ?Vaping Use  ? Vaping Use: Never used  ?Substance and Sexual Activity  ? Alcohol use: Yes  ?  Comment: 0-2 drinks a month  ? Drug use: Never  ? Sexual activity: Yes  ?  Partners: Male  ?  Birth control/protection: Surgical  ?  Comment: tubal ligation  ?Other Topics Concern  ? Not on file  ?Social History Narrative  ? Not on file  ? ?Social Determinants of Health  ? ?Financial Resource Strain: Not on file  ?Food Insecurity: Not on file  ?Transportation Needs: Not on file  ?Physical Activity: Not on file  ?Stress: Not on file  ?Social Connections: Not on file  ?Intimate Partner Violence: Not on file  ? ? ?Physical Exam: ?Vital signs in last 24 hours: ?BP 105/70   Pulse 75   Temp 97.9 ?F (36.6 ?C) (Temporal)   Resp 17   Ht 5' 9"  (1.753 m)   Wt 146 lb (66.2 kg)   LMP 07/12/2011   SpO2 100%   BMI 21.56 kg/m?  ?GEN: NAD ?EYE: Sclerae anicteric ?ENT: MMM ?CV: Non-tachycardic ?Pulm: No increased WOB ?GI: Soft ?NEURO:  Alert & Oriented ? ? ?Christia Reading, MD ?[facility] ? ? ?09/09/2021 9:16 AM ? ?

## 2021-09-09 NOTE — Patient Instructions (Addendum)
Handout on polyps, Gastritis, diverticulosis, hemorrhoids provided  ? ?Await pathology results.  ? ?Continue current medications.  ? ?Start Omeprazole 40 mg by mouth twice a day.  ? ?Return to GI clinic in 2 months  ? ?YOU HAD AN ENDOSCOPIC PROCEDURE TODAY AT [facility]:   Refer to the procedure report that was given to you for any specific questions about what was found during the examination.  If the procedure report does not answer your questions, please call your gastroenterologist to clarify.  If you requested that your care partner not be given the details of your procedure findings, then the procedure report has been included in a sealed envelope for you to review at your convenience later. ? ?YOU SHOULD EXPECT: Some feelings of bloating in the abdomen. Passage of more gas than usual.  Walking can help get rid of the air that was put into your GI tract during the procedure and reduce the bloating. If you had a lower endoscopy (such as a colonoscopy or flexible sigmoidoscopy) you may notice spotting of blood in your stool or on the toilet paper. If you underwent a bowel prep for your procedure, you may not have a normal bowel movement for a few days. ? ?Please Note:  You might notice some irritation and congestion in your nose or some drainage.  This is from the oxygen used during your procedure.  There is no need for concern and it should clear up in a day or so. ? ?SYMPTOMS TO REPORT IMMEDIATELY: ? ?Following lower endoscopy (colonoscopy or flexible sigmoidoscopy): ? Excessive amounts of blood in the stool ? Significant tenderness or worsening of abdominal pains ? Swelling of the abdomen that is new, acute ? Fever of 100?F or higher ? ?Following upper endoscopy (EGD) ? Vomiting of blood or coffee ground material ? New chest pain or pain under the shoulder blades ? Painful or persistently difficult swallowing ? New shortness of breath ? Fever of 100?F or higher ? Black, tarry-looking  stools ? ?For urgent or emergent issues, a gastroenterologist can be reached at any hour by calling [phone]. ?Do not use MyChart messaging for urgent concerns.  ? ? ?DIET:  We do recommend a small meal at first, but then you may proceed to your regular diet.  Drink plenty of fluids but you should avoid alcoholic beverages for 24 hours. ? ?ACTIVITY:  You should plan to take it easy for the rest of today and you should NOT DRIVE or use heavy machinery until tomorrow (because of the sedation medicines used during the test).   ? ?FOLLOW UP: ?Our staff will call the number listed on your records 48-72 hours following your procedure to check on you and address any questions or concerns that you may have regarding the information given to you following your procedure. If we do not reach you, we will leave a message.  We will attempt to reach you two times.  During this call, we will ask if you have developed any symptoms of COVID 19. If you develop any symptoms (ie: fever, flu-like symptoms, shortness of breath, cough etc.) before then, please call [phone].  If you test positive for Covid 19 in the 2 weeks post procedure, please call and report this information to [place].   ? ?If any biopsies were taken you will be contacted by phone or by letter within the next 1-3 weeks.  Please call us at [phone] if you have not heard about the biopsies  in 3 weeks.  ? ? ?SIGNATURES/CONFIDENTIALITY: ?You and/or your care partner have signed paperwork which will be entered into your electronic medical record.  These signatures attest to the fact that that the information above on your After Visit Summary has been reviewed and is understood.  Full responsibility of the confidentiality of this discharge information lies with you and/or your care-partner. ? ?

## 2021-09-09 NOTE — Telephone Encounter (Signed)
Carrie [name] contacted the on call service provider last night because she vomited up a large amount of the first half of her miralax bowel prep in preparation for an EGD/colonoscopy tomorrow with Dr. Lorenso Courier.  She states that she took the 4 Dulcolax tablets and had finished the first half of the Miralax well over an hour before she got nauseated and vomited a large amount of liquid emesis.  She has not yet started having bowel movements.  ?I suggested to her that if there were any pharmacies still open she could try going and getting another bottle of Miralax and start drinking, but go slower and start earlier, drinking a glass every 20-25 minutes.  Patient indicated understanding ?

## 2021-09-09 NOTE — Op Note (Signed)
[place] ?Patient Name: Carrie [name] ?Procedure Date: 09/09/2021 9:15 AM ?MRN: [id number] ?Endoscopist: Sonny Masters "Christia Reading ,  ?Age: 51 ?Referring MD:  ?Date of Birth: 05/09/1971 ?Gender: Female ?Account #: [id number] ?Procedure:                Upper GI endoscopy ?Indications:              Dysphagia, Weight loss ?Medicines:                Monitored Anesthesia Care ?Procedure:                Pre-Anesthesia Assessment: ?                          - Prior to the procedure, a History and Physical  ?                          was performed, and patient medications and  ?                          allergies were reviewed. The patient's tolerance of  ?                          previous anesthesia was also reviewed. The risks  ?                          and benefits of the procedure and the sedation  ?                          options and risks were discussed with the patient.  ?                          All questions were answered, and informed consent  ?                          was obtained. Prior Anticoagulants: The patient has  ?                          taken no previous anticoagulant or antiplatelet  ?                          agents. ASA Grade Assessment: II - A patient with  ?                          mild systemic disease. After reviewing the risks  ?                          and benefits, the patient was deemed in  ?                          satisfactory condition to undergo the procedure. ?                          After obtaining informed consent, the endoscope was  ?  passed under direct vision. Throughout the  ?                          procedure, the patient's blood pressure, pulse, and  ?                          oxygen saturations were monitored continuously. The  ?                          GIF [id number] #[id number] was introduced through the  ?                          mouth, and advanced to the second part of duodenum.  ?                          The upper GI endoscopy was  accomplished without  ?                          difficulty. The patient tolerated the procedure  ?                          well. ?Scope In: ?Scope Out: ?Findings:                 The examined esophagus was normal. Biopsies were  ?                          taken with a cold forceps for histology. ?                          A single 10 mm papule (nodule) with no bleeding and  ?                          no stigmata of recent bleeding was found in the  ?                          cardia. Biopsies were taken with a cold forceps for  ?                          histology. ?                          Localized inflammation characterized by congestion  ?                          (edema) and erythema was found in the gastric  ?                          antrum. Biopsies were taken with a cold forceps for  ?                          histology. ?                          A single 6 mm sessile polyp with  no bleeding was  ?                          found in the second portion of the duodenum.  ?                          Biopsies were taken with a cold forceps for  ?                          histology. ?                          Biopsies were taken with a cold forceps in the  ?                          duodenal bulb and in the second portion of the  ?                          duodenum for histology. ?Complications:            No immediate complications. ?Estimated Blood Loss:     Estimated blood loss was minimal. ?Impression:               - Normal esophagus. Biopsied. ?                          - A single papule (nodule) found in the stomach.  ?                          Biopsied. ?                          - Gastritis. Biopsied. ?                          - A single duodenal polyp. Biopsied. ?                          - Biopsies were taken with a cold forceps for  ?                          histology in the duodenal bulb and in the second  ?                          portion of the duodenum. ?Recommendation:           - Await  pathology results. ?                          - Use Prilosec (omeprazole) 40 mg PO BID for 8  ?                          weeks. ?                          - Perform a colonoscopy today. ?Georgian Co,  ?09/09/2021 10:08:51 AM ?

## 2021-09-09 NOTE — Progress Notes (Signed)
To pacu, VSS. Report to rn.tb °

## 2021-09-09 NOTE — Progress Notes (Signed)
Vital signs checked by:CW ? ?The medical and surgical history was reviewed and verified with the patient. ? ?

## 2021-09-09 NOTE — Progress Notes (Signed)
Called to room to assist during endoscopic procedure.  Patient ID and intended procedure confirmed with present staff. Received instructions for my participation in the procedure from the performing physician.  

## 2021-09-09 NOTE — Telephone Encounter (Signed)
Was intending to call pt to f/u, however, appears pt has already arrived and been checked in for her procedure as indicated below: ? ?Appointment Information  ?Name: Carrie [name], Prest MRN: [id number]  ?Date: 09/09/2021 Status: Arrived  ?Time: 8:30 AM Length: 60  ?Visit Type: PROPOFOL ENDO COL [1273] Copay: $0.00  ?Provider: Sharyn Creamer, MD Department: [facility]  ?Referring Provider: Vivi Barrack CSN: [id number]  ?Enc Form #: [id number]      ?Notes: positive Cologuard; dysphagia; weight loss  ?Arrival Time: 7:36 AM    ?Made On: ?Checked In: 07/28/2021 3:57 PM ?09/09/2021 7:36 AM By: ?By: Audrea Muscat ?ROACH, Post  ? ?

## 2021-09-10 NOTE — Telephone Encounter (Signed)
Sent referrals coordinator Lattie Haw a message ?

## 2021-09-13 ENCOUNTER — Telehealth: Payer: Self-pay | Admitting: *Deleted

## 2021-09-13 NOTE — Telephone Encounter (Signed)
No answer on first attempt follow up call. Left message.  ?

## 2021-09-14 ENCOUNTER — Other Ambulatory Visit: Payer: Self-pay

## 2021-09-14 ENCOUNTER — Ambulatory Visit (INDEPENDENT_AMBULATORY_CARE_PROVIDER_SITE_OTHER): Payer: BC Managed Care – PPO | Admitting: *Deleted

## 2021-09-14 DIAGNOSIS — E538 Deficiency of other specified B group vitamins: Secondary | ICD-10-CM | POA: Diagnosis not present

## 2021-09-14 MED ORDER — CYANOCOBALAMIN 1000 MCG/ML IJ SOLN
1000.0000 ug | Freq: Once | INTRAMUSCULAR | Status: AC
  Administered 2021-09-14: 1000 ug via INTRAMUSCULAR

## 2021-09-14 NOTE — Progress Notes (Addendum)
Pt arrived for B12 injection, given in L deltoid. Pt tolerated injection well, showed no signs of distress nor voiced any concerns.  ?

## 2021-09-16 ENCOUNTER — Encounter: Payer: Self-pay | Admitting: Internal Medicine

## 2021-09-23 ENCOUNTER — Ambulatory Visit: Payer: BC Managed Care – PPO | Admitting: Rheumatology

## 2021-09-29 ENCOUNTER — Other Ambulatory Visit: Payer: Self-pay | Admitting: Internal Medicine

## 2021-09-29 ENCOUNTER — Ambulatory Visit: Payer: BC Managed Care – PPO | Admitting: Internal Medicine

## 2021-09-29 DIAGNOSIS — R1310 Dysphagia, unspecified: Secondary | ICD-10-CM

## 2021-10-04 ENCOUNTER — Telehealth: Payer: Self-pay | Admitting: Family Medicine

## 2021-10-04 NOTE — Telephone Encounter (Signed)
Patient is having a lot of issues with rheumatology referral.  ? ?Patient found [facility]- [address] [place] [address] ?[phone] fax [phone].  ? ?Patient would like referral to be sent to their office please.  ? ?Information has been sent to [facility] via inbasket.  ? ?

## 2021-10-07 ENCOUNTER — Encounter: Payer: Self-pay | Admitting: Family Medicine

## 2021-10-11 ENCOUNTER — Encounter: Payer: Self-pay | Admitting: Internal Medicine

## 2021-10-11 DIAGNOSIS — R1310 Dysphagia, unspecified: Secondary | ICD-10-CM

## 2021-10-12 ENCOUNTER — Telehealth: Payer: Self-pay | Admitting: Family Medicine

## 2021-10-12 MED ORDER — OMEPRAZOLE 40 MG PO CPDR: 40.0000 mg | DELAYED_RELEASE_CAPSULE | Freq: Two times a day (BID) | ORAL | 1 refills | Status: DC

## 2021-10-12 NOTE — Telephone Encounter (Signed)
[facility] is requesting to have a referral faxed to their office at [phone]. She stated pt has scheduled with their office. I have also sent a msg to Linus Galas ?

## 2021-10-13 ENCOUNTER — Other Ambulatory Visit: Payer: Self-pay | Admitting: *Deleted

## 2021-10-13 DIAGNOSIS — M25552 Pain in left hip: Secondary | ICD-10-CM

## 2021-10-13 NOTE — Telephone Encounter (Signed)
Ok to send referral for patient  ?

## 2021-10-13 NOTE — Telephone Encounter (Signed)
Ok to place referral. ? ?[name]. [name], MD ?10/13/2021 8:02 AM  ? ?

## 2021-10-13 NOTE — Telephone Encounter (Signed)
Referral placed.

## 2021-10-27 ENCOUNTER — Ambulatory Visit (INDEPENDENT_AMBULATORY_CARE_PROVIDER_SITE_OTHER): Payer: BC Managed Care – PPO | Admitting: *Deleted

## 2021-10-27 DIAGNOSIS — E538 Deficiency of other specified B group vitamins: Secondary | ICD-10-CM

## 2021-10-27 MED ORDER — CYANOCOBALAMIN 1000 MCG/ML IJ SOLN
1000.0000 ug | Freq: Once | INTRAMUSCULAR | Status: AC
  Administered 2021-10-27: 1000 ug via INTRAMUSCULAR

## 2021-10-27 NOTE — Progress Notes (Addendum)
Patient presented for B 12 injection to right deltoid, patient voiced no concerns nor showed any signs of distress during injection. 

## 2021-11-10 ENCOUNTER — Other Ambulatory Visit (INDEPENDENT_AMBULATORY_CARE_PROVIDER_SITE_OTHER): Payer: BC Managed Care – PPO

## 2021-11-10 ENCOUNTER — Encounter: Payer: Self-pay | Admitting: Internal Medicine

## 2021-11-10 ENCOUNTER — Ambulatory Visit: Payer: BC Managed Care – PPO | Admitting: Internal Medicine

## 2021-11-10 VITALS — BP 118/60 | HR 99 | Ht 69.0 in | Wt 136.0 lb

## 2021-11-10 DIAGNOSIS — K5900 Constipation, unspecified: Secondary | ICD-10-CM

## 2021-11-10 DIAGNOSIS — K2970 Gastritis, unspecified, without bleeding: Secondary | ICD-10-CM

## 2021-11-10 DIAGNOSIS — H6983 Other specified disorders of Eustachian tube, bilateral: Secondary | ICD-10-CM

## 2021-11-10 DIAGNOSIS — H9203 Otalgia, bilateral: Secondary | ICD-10-CM | POA: Diagnosis not present

## 2021-11-10 DIAGNOSIS — R634 Abnormal weight loss: Secondary | ICD-10-CM | POA: Diagnosis not present

## 2021-11-10 DIAGNOSIS — L603 Nail dystrophy: Secondary | ICD-10-CM | POA: Diagnosis not present

## 2021-11-10 DIAGNOSIS — O99891 Other specified diseases and conditions complicating pregnancy: Secondary | ICD-10-CM

## 2021-11-10 DIAGNOSIS — K2990 Gastroduodenitis, unspecified, without bleeding: Secondary | ICD-10-CM

## 2021-11-10 DIAGNOSIS — Z3A00 Weeks of gestation of pregnancy not specified: Secondary | ICD-10-CM

## 2021-11-10 MED ORDER — LINACLOTIDE 145 MCG PO CAPS: 145.0000 ug | ORAL_CAPSULE | Freq: Every day | ORAL | 2 refills | Status: DC

## 2021-11-10 NOTE — Progress Notes (Signed)
? ?Chief Complaint: Positive Cologuard, weight loss ? ?HPI : 51 year old female with history of colon polyps, hypothyroidism presents with positive Cologuard and weight loss ? ?Interval History: Her stomach pain has resolved completely on the omeprazole 40 mg twice daily. Not having any reflux symptoms. She is not taking Miralax currently. She is taking DigestLive, which has been helping with her constipation. She is on average having one BM once every 4-5 days. Her weight loss has stabilized since her last appt. Her nails have become more dystrophic appearing over the last 6-8 months. Prior to that, her nails were normal appearing. She has been referred to rheumatology for further work up of a positive ANA. Patient's vision has been worsening recently, though she has attributed this to normal aging. Denies any rashes. ? ?Wt Readings from Last 3 Encounters:  ?11/10/21 136 lb (61.7 kg)  ?09/09/21 146 lb (66.2 kg)  ?07/28/21 146 lb (66.2 kg)  ? ?Current Outpatient Medications  ?Medication Sig Dispense Refill  ? albuterol (VENTOLIN HFA) 108 (90 Base) MCG/ACT inhaler Inhale 2 puffs into the lungs every 4 (four) hours as needed for shortness of breath. 8 g 2  ? ARMOUR THYROID 120 MG tablet TAKE 1 TABLET (120 MG TOTAL) BY MOUTH DAILY. 90 tablet 0  ? buPROPion (WELLBUTRIN XL) 300 MG 24 hr tablet Take 1 tablet (300 mg total) by mouth daily. 90 tablet 1  ? cyclobenzaprine (FLEXERIL) 10 MG tablet TAKE 1 TABLET BY MOUTH THREE TIMES A DAY AS NEEDED FOR MUSCLE SPASMS 30 tablet 0  ? estradiol (VIVELLE-DOT) 0.05 MG/24HR patch Place 1 patch (0.05 mg total) onto the skin 2 (two) times a week. 24 patch 3  ? Magnesium 100 MG CAPS     ? Multiple Vitamin (MULTIVITAMIN) tablet Take 1 tablet by mouth daily.    ? NALTREXONE HCL PO Take by mouth.    ? NONFORMULARY OR COMPOUNDED ITEM Testosterone 1% cream apply 0.5 grams daily to her lower abdomen ?(Per pharmacy #15 is a 30 days supply) 15 each 5  ? omeprazole (PRILOSEC) 40 MG capsule Take  1 capsule (40 mg total) by mouth 2 (two) times daily. 60 capsule 1  ? progesterone (PROMETRIUM) 100 MG capsule Take 1 capsule (100 mg total) by mouth daily. 90 capsule 3  ? SUMAtriptan (IMITREX) 100 MG tablet Take 1 tablet (100 mg total) by mouth daily as needed. 10 tablet 0  ? topiramate (TOPAMAX) 50 MG tablet Take 50 mg by mouth 2 (two) times daily.    ? traZODone (DESYREL) 50 MG tablet Take 1 tablet (50 mg total) by mouth at bedtime. 90 tablet 3  ? ?No current facility-administered medications for this visit.  ? ?Allergies  ?Allergen Reactions  ? Gluten Meal   ? Oseltamivir Other (See Comments)  ?  Severe jaw Pain  ? Other Other (See Comments)  ?  Beef ?Causes stomach pain  ? Tamiflu [Oseltamivir Phosphate]   ? Zofran [Ondansetron]   ?  rash  ? ?Review of Systems: ?All systems reviewed and negative except where noted in HPI.  ? ?Physical Exam: ?BP 118/60   Pulse 99   Ht 5' 9"  (1.753 m)   Wt 136 lb (61.7 kg)   LMP 07/12/2011   BMI 20.08 kg/m?  ?Constitutional: Pleasant,well-developed, female in no acute distress. ?HEENT: Normocephalic and atraumatic. Conjunctivae are normal. No scleral icterus. ?Cardiovascular: Normal rate, regular rhythm.  ?Pulmonary/chest: Effort normal and breath sounds normal. No wheezing, rales or rhonchi. ?Abdominal: Soft, nondistended, nontender. Bowel  sounds active throughout. There are no masses palpable. No hepatomegaly. ?Extremities: No edema ?Neurological: Alert and oriented to person place and time. ?Skin: Skin is warm and dry. No rashes noted. ?Psychiatric: Normal mood and affect. Behavior is normal. ? ?Labs 04/2021: Positive Cologuard ? ?Labs 06/2021: CMP nml, CBC nml. TSH nml. ? ?EGD 09/09/21: ?- Normal esophagus. Biopsied. ?- A single papule (nodule) found in the stomach. Biopsied. ?- Gastritis. Biopsied. ?- A single duodenal polyp. Biopsied. ?- Biopsies were taken with a cold forceps for histology in the duodenal bulb and in the second portion of the duodenum. ?Path: ?1.  Surgical [P], duodenum ?- DUODENAL MUCOSA WITH NO SPECIFIC HISTOPATHOLOGIC CHANGES ?- NEGATIVE FOR INCREASED INTRAEPITHELIAL LYMPHOCYTES OR VILLOUS ARCHITECTURAL CHANGES ?2. Surgical [P], duodenal polyp, polyp (1) ?- POLYPOID DUODENAL MUCOSA WITH REACTIVE CHANGES. SEE NOTE ?- NEGATIVE FOR DYSPLASIA ?2. Differential diagnosis can include benign Brunner's gland hyperplasia ?3. Surgical [P], gastric ?- GASTRIC ANTRAL AND OXYNTIC MUCOSA WITH MILD CHRONIC GASTRITIS. SEE NOTE ?Immunohistochemical stain for Helicobacter pylori is negative. ?4. Surgical [P], GE junction nodule, polyp (1) (nodule) ?- ESOPHAGEAL SQUAMOUS AND CARDIAC MUCOSA WITH INTESTINAL METAPLASIA. SEE NOTE ?- NEGATIVE FOR DYSPLASIA ?4. The findings are consistent with Barrett?s esophagus in an appropriate clinical setting. ?5. Surgical [P], random sites esophageal ?- ESOPHAGEAL SQUAMOUS MUCOSA WITH NO SPECIFIC HISTOPATHOLOGIC CHANGES ?- NEGATIVE FOR INCREASED INTRAEPITHELIAL EOSINOPHILS ? ?Colonoscopy 09/09/21: ?- The examined portion of the ileum was normal. ?- Diverticulosis in the sigmoid colon. ?- Non-bleeding internal hemorrhoids. ?- No specimens collected. ? ?ASSESSMENT AND PLAN: ?Gastritis ?Weight loss ?Constipation ?Nail dystrophy ?Patient presents for follow up of constipation and weight loss. Since she has not been responsive to Miralax therapy, will have the patient trial Linzess to see if this more effective for treating her constipation symptoms. She has continued to have weight loss, unclear what is driving this. Will have her talk to nutrition about potentially starting weight loss supplements. I will also check her vitamin levels since patient describes some issues with nail dystrophy and has had a recent worsening in her vision.  ?- Check vitamin A, zinc, copper, vitamin C, folic acid ?- Nutrition consult  ?- Start Linzess 145 mcg QD ?- Patient states that she is going to have her thyroid levels checked soon.  ?- RTC 2 months ? ?Christia Reading, MD ? ?I spent 44 minutes of time, including in depth chart review, independent review of results as outlined above, communicating results with the patient directly, face-to-face time with the patient, coordinating care, ordering studies and medications as appropriate, and documentation. ? ?

## 2021-11-10 NOTE — Patient Instructions (Addendum)
Your provider has requested that you go to the basement level for lab work before leaving today. Press "B" on the elevator. The lab is located at the first door on the left as you exit the elevator. ? ?We have sent the following medications to your pharmacy for you to pick up at your convenience: Linzess 145 mcg to take one tablet by mouth daily in the morning prior to breakfast.  ? ?We have referred you to Nutrition and Diabetes management. They will contact you directly with an appointment time. ? ?The  GI providers would like to encourage you to use [facility] to communicate with providers for non-urgent requests or questions.  Due to long hold times on the telephone, sending your provider a message by [facility] may be a faster and more efficient way to get a response.  Please allow 48 business hours for a response.  Please remember that this is for non-urgent requests.  ? ?Due to recent changes in healthcare laws, you may see the results of your imaging and laboratory studies on MyChart before your provider has had a chance to review them.  We understand that in some cases there may be results that are confusing or concerning to you. Not all laboratory results come back in the same time frame and the provider may be waiting for multiple results in order to interpret others.  Please give [place] 48 hours in order for your provider to thoroughly review all the results before contacting the office for clarification of your results.  ? ? ?

## 2021-11-11 LAB — FOLATE: Folate: 9 ng/mL (ref >5.9)

## 2021-11-12 ENCOUNTER — Telehealth: Payer: BC Managed Care – PPO | Admitting: Family

## 2021-11-12 DIAGNOSIS — J019 Acute sinusitis, unspecified: Secondary | ICD-10-CM

## 2021-11-12 MED ORDER — AMOXICILLIN-POT CLAVULANATE 875-125 MG PO TABS: 1.0000 | ORAL_TABLET | Freq: Two times a day (BID) | ORAL | 0 refills | Status: DC

## 2021-11-12 NOTE — Progress Notes (Signed)
?Virtual Visit Consent  ? ?Carrie [name], you are scheduled for a virtual visit with a [facility] provider today. Just as with appointments in the office, your consent must be obtained to participate. Your consent will be active for this visit and any virtual visit you may have with one of our providers in the next 365 days. If you have a MyChart account, a copy of this consent can be sent to you electronically. ? ?As this is a virtual visit, video technology does not allow for your provider to perform a traditional examination. This may limit your provider's ability to fully assess your condition. If your provider identifies any concerns that need to be evaluated in person or the need to arrange testing (such as labs, EKG, etc.), we will make arrangements to do so. Although advances in technology are sophisticated, we cannot ensure that it will always work on either your end or our end. If the connection with a video visit is poor, the visit may have to be switched to a telephone visit. With either a video or telephone visit, we are not always able to ensure that we have a secure connection. ? ?By engaging in this virtual visit, you consent to the provision of healthcare and authorize for your insurance to be billed (if applicable) for the services provided during this visit. Depending on your insurance coverage, you may receive a charge related to this service. ? ?I need to obtain your verbal consent now. Are you willing to proceed with your visit today? [name] has provided verbal consent on 11/12/2021 for a virtual visit (video or telephone). [name], FNP ? ?Date: 11/12/2021 8:33 AM ? ?Virtual Visit via Video Note  ? ?IEvelina Dun, connected with  Carrie [name]  ([id number], Nov 11, 1970) on 11/12/21 at  8:30 AM EDT by a video-enabled telemedicine application and verified that I am speaking with the correct person using two identifiers. ? ?Location: ?Patient: Virtual Visit Location Patient:  Home ?Provider: Virtual Visit Location Provider: Home Office ?  ?I discussed the limitations of evaluation and management by telemedicine and the availability of in person appointments. The patient expressed understanding and agreed to proceed.   ? ?History of Present Illness: ?Carrie [name] is a 51 y.o. who identifies as a female who was assigned female at birth, and is being seen today for sinus issues for the last few weeks. States her allergies have been worsening over the last month.  ? ?HPI: Sinusitis ?This is a new problem. The current episode started 1 to 4 weeks ago. The problem has been gradually worsening since onset. There has been no fever. Her pain is at a severity of 2/10. The pain is mild. Associated symptoms include congestion, coughing, ear pain, headaches, sinus pressure and a sore throat. Pertinent negatives include no hoarse voice, shortness of breath or sneezing. Past treatments include oral decongestants and acetaminophen. The treatment provided mild relief.   ?Problems:  ?Patient Active Problem List  ? Diagnosis Date Noted  ? Body mass index (BMI) 25.0-25.9, adult 07/20/2021  ? History of thyroidectomy 07/20/2021  ? Menopause 07/20/2021  ? Mixed hyperlipidemia 07/20/2021  ? Vitamin D deficiency 07/20/2021  ? Insomnia 02/23/2021  ? Dyslipidemia 07/15/2019  ? Left hip pain 07/10/2019  ? Acetabular labrum tear 05/20/2019  ? Constipation 09/03/2018  ? Varicose veins of both lower extremities with pain 04/20/2018  ? Migraines 04/13/2018  ? Hypothyroidism 04/13/2018  ? 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 History of asthma 04/13/2018  ?  Hypothyroidism due to Hashimoto's thyroiditis 02/03/2017  ? Thyroid nodule 02/03/2017  ? 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 Migraine without aura and without status migrainosus, not intractable 10/10/2016  ? Mild intermittent asthma without complication 29/79/8921  ? Encounter for sterilization 07/09/2015  ? Punctate  keratitis 01/09/2015  ? 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 Asthma 07/03/2014  ? Cervico-occipital neuralgia 07/03/2014  ? Depression with anxiety 07/03/2014  ? Intention tremor 07/03/2014  ? Anxiety 11/14/2012  ? H/O cervical discectomy 11/14/2012  ? Acute embolism and thrombosis of other specified veins 10/16/1998  ? Fibrocystic disease of breast 10/16/1998  ?  ?Allergies:  ?Allergies  ?Allergen Reactions  ? Gluten Meal   ? Oseltamivir Other (See Comments)  ?  Severe jaw Pain  ? Other Other (See Comments)  ?  Beef ?Causes stomach pain  ? Tamiflu [Oseltamivir Phosphate]   ? Zofran [Ondansetron]   ?  rash  ? ?Medications:  ?Current Outpatient Medications:  ?  amoxicillin-clavulanate (AUGMENTIN) 875-125 MG tablet, Take 1 tablet by mouth 2 (two) times daily., Disp: 14 tablet, Rfl: 0 ?  albuterol (VENTOLIN HFA) 108 (90 Base) MCG/ACT inhaler, Inhale 2 puffs into the lungs every 4 (four) hours as needed for shortness of breath., Disp: 8 g, Rfl: 2 ?  ARMOUR THYROID 120 MG tablet, TAKE 1 TABLET (120 MG TOTAL) BY MOUTH DAILY., Disp: 90 tablet, Rfl: 0 ?  buPROPion (WELLBUTRIN XL) 300 MG 24 hr tablet, Take 1 tablet (300 mg total) by mouth daily., Disp: 90 tablet, Rfl: 1 ?  cyclobenzaprine (FLEXERIL) 10 MG tablet, TAKE 1 TABLET BY MOUTH THREE TIMES A DAY AS NEEDED FOR MUSCLE SPASMS, Disp: 30 tablet, Rfl: 0 ?  estradiol (VIVELLE-DOT) 0.05 MG/24HR patch, Place 1 patch (0.05 mg total) onto the skin 2 (two) times a week., Disp: 24 patch, Rfl: 3 ?  linaclotide (LINZESS) 145 MCG CAPS capsule, Take 1 capsule (145 mcg total) by mouth daily before breakfast., Disp: 30 capsule, Rfl: 2 ?  Magnesium 100 MG CAPS, , Disp: , Rfl:  ?  Multiple Vitamin (MULTIVITAMIN) tablet, Take 1 tablet by mouth daily., Disp: , Rfl:  ?  NALTREXONE HCL PO, Take by mouth., Disp: , Rfl:  ?  NONFORMULARY OR COMPOUNDED ITEM, Testosterone 1% cream apply 0.5 grams daily to her lower abdomen (Per pharmacy #15 is a 30 days supply), Disp: 15 each, Rfl: 5 ?   omeprazole (PRILOSEC) 40 MG capsule, Take 1 capsule (40 mg total) by mouth 2 (two) times daily., Disp: 60 capsule, Rfl: 1 ?  progesterone (PROMETRIUM) 100 MG capsule, Take 1 capsule (100 mg total) by mouth daily., Disp: 90 capsule, Rfl: 3 ?  SUMAtriptan (IMITREX) 100 MG tablet, Take 1 tablet (100 mg total) by mouth daily as needed., Disp: 10 tablet, Rfl: 0 ?  topiramate (TOPAMAX) 50 MG tablet, Take 50 mg by mouth 2 (two) times daily., Disp: , Rfl:  ?  traZODone (DESYREL) 50 MG tablet, Take 1 tablet (50 mg total) by mouth at bedtime., Disp: 90 tablet, Rfl: 3 ? ?Observations/Objective: ?Patient is well-developed, well-nourished in no acute distress.  ?Resting comfortably  at home.  ?Head is normocephalic, atraumatic.  ?No labored breathing.  ?Speech is clear and coherent with logical content.  ?Patient is alert and oriented at baseline.  ?Throat clearing and nasal congestion  ? ?Assessment and Plan: ?1. Acute sinusitis, recurrence not specified, unspecified location ?- amoxicillin-clavulanate (AUGMENTIN) 875-125 MG tablet; Take 1 tablet by mouth 2 (two) times daily.  Dispense:  14 tablet; Refill: 0 ? ?- Take meds as prescribed ?- Use a cool mist humidifier  ?-Use saline nose sprays frequently ?-Force fluids ?-For any cough or congestion ? Use plain Mucinex- regular strength or max strength is fine ?-For fever or aces or pains- take tylenol or ibuprofen. ?-Throat lozenges if help ?-New toothbrush in 3 days ? ? ? ?Follow Up Instructions: ?I discussed the assessment and treatment plan with the patient. The patient was provided an opportunity to ask questions and all were answered. The patient agreed with the plan and demonstrated an understanding of the instructions.  A copy of instructions were sent to the patient via MyChart unless otherwise noted below.  ? ? ? ?The patient was advised to call back or seek an in-person evaluation if the symptoms worsen or if the condition fails to improve as anticipated. ? ?Time:  ?I  spent 6 minutes with the patient via telehealth technology discussing the above problems/concerns.   ? ?[name], FNP ? ?

## 2021-11-15 DIAGNOSIS — G894 Chronic pain syndrome: Secondary | ICD-10-CM | POA: Insufficient documentation

## 2021-11-15 DIAGNOSIS — U099 Post covid-19 condition, unspecified: Secondary | ICD-10-CM | POA: Insufficient documentation

## 2021-11-17 LAB — VITAMIN A: Vitamin A (Retinoic Acid): 50 ug/dL (ref 38–98)

## 2021-11-17 LAB — VITAMIN C: Vitamin C: 1 mg/dL (ref 0.3–2.7)

## 2021-11-17 LAB — ZINC: Zinc: 87 ug/dL (ref 60–130)

## 2021-11-17 LAB — COPPER, BLOOD: Copper, Blood: 90 ug/dL

## 2021-11-29 ENCOUNTER — Ambulatory Visit: Payer: BC Managed Care – PPO

## 2021-12-10 ENCOUNTER — Encounter: Payer: Self-pay | Admitting: Internal Medicine

## 2021-12-10 ENCOUNTER — Other Ambulatory Visit: Payer: Self-pay

## 2021-12-10 DIAGNOSIS — K5900 Constipation, unspecified: Secondary | ICD-10-CM

## 2021-12-10 MED ORDER — LINACLOTIDE 290 MCG PO CAPS: 290.0000 ug | ORAL_CAPSULE | Freq: Every day | ORAL | 0 refills | Status: DC

## 2022-01-12 ENCOUNTER — Ambulatory Visit: Payer: BC Managed Care – PPO | Admitting: Internal Medicine

## 2022-01-12 ENCOUNTER — Encounter: Payer: Self-pay | Admitting: Internal Medicine

## 2022-01-12 ENCOUNTER — Other Ambulatory Visit: Payer: BC Managed Care – PPO

## 2022-01-12 VITALS — BP 100/70 | HR 88 | Ht 67.0 in | Wt 134.4 lb

## 2022-01-12 DIAGNOSIS — R1310 Dysphagia, unspecified: Secondary | ICD-10-CM | POA: Diagnosis not present

## 2022-01-12 DIAGNOSIS — K31A0 Gastric intestinal metaplasia, unspecified: Secondary | ICD-10-CM

## 2022-01-12 DIAGNOSIS — R6882 Decreased libido: Secondary | ICD-10-CM

## 2022-01-12 DIAGNOSIS — F5231 Female orgasmic disorder: Secondary | ICD-10-CM

## 2022-01-12 DIAGNOSIS — K5900 Constipation, unspecified: Secondary | ICD-10-CM | POA: Diagnosis not present

## 2022-01-12 MED ORDER — LUBIPROSTONE 24 MCG PO CAPS: 24.0000 ug | ORAL_CAPSULE | Freq: Two times a day (BID) | ORAL | 3 refills | Status: DC

## 2022-01-12 MED ORDER — OMEPRAZOLE 40 MG PO CPDR: 40.0000 mg | DELAYED_RELEASE_CAPSULE | Freq: Every day | ORAL | 11 refills | Status: DC

## 2022-01-12 NOTE — Progress Notes (Signed)
Chief Complaint: Constipation  HPI : 51 year old female with history of psoriasis, colon polyps, hypothyroidism presents for follow up for constipation and weight loss  Interval History: The Linzess has helped with the bloating the discomfort but not with her stool frequency. She has on average having one BM every few days. The DigestLive is not really helping anymore so she stopped that. She will try several different OTC laxatives to help with her constipation. She will use Fleet enemas on occasion. She saw her rheumatologist who did not think that she has any significant rheumatologic disorders. 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 Weight loss has stabilized more recently. She does still have a poor appetite. She is planning to move to [place] in 1.5 weeks.  Wt Readings from Last 3 Encounters:  01/12/22 134 lb 6 oz (61 kg)  11/10/21 136 lb (61.7 kg)  09/09/21 146 lb (66.2 kg)    Current Outpatient Medications  Medication Sig Dispense Refill   albuterol (VENTOLIN HFA) 108 (90 Base) MCG/ACT inhaler Inhale 2 puffs into the lungs every 4 (four) hours as needed for shortness of breath. 8 g 2   ARMOUR THYROID 120 MG tablet TAKE 1 TABLET (120 MG TOTAL) BY MOUTH DAILY. 90 tablet 0   buPROPion (WELLBUTRIN XL) 300 MG 24 hr tablet Take 1 tablet (300 mg total) by mouth daily. 90 tablet 1   estradiol (VIVELLE-DOT) 0.05 MG/24HR patch Place 1 patch (0.05 mg total) onto the skin 2 (two) times a week. 24 patch 3   linaclotide (LINZESS) 290 MCG CAPS capsule Take 1 capsule (290 mcg total) by mouth daily before breakfast. 30 capsule 0   Magnesium 100 MG CAPS      Multiple Vitamin (MULTIVITAMIN) tablet Take 1 tablet by mouth daily.     NALTREXONE HCL PO Take by mouth.     NONFORMULARY OR COMPOUNDED ITEM Testosterone 1% cream apply 0.5 grams daily to her lower abdomen (Per pharmacy #15 is a 30 days supply) 15 each 5   omeprazole (PRILOSEC) 40 MG capsule Take 1 capsule (40 mg total) by mouth 2  (two) times daily. 60 capsule 1   progesterone (PROMETRIUM) 100 MG capsule Take 1 capsule (100 mg total) by mouth daily. 90 capsule 3   SUMAtriptan (IMITREX) 100 MG tablet Take 1 tablet (100 mg total) by mouth daily as needed. 10 tablet 0   topiramate (TOPAMAX) 50 MG tablet Take 50 mg by mouth 2 (two) times daily.     No current facility-administered medications for this visit.   Allergies  Allergen Reactions   Gluten Meal    Oseltamivir Other (See Comments)    Severe jaw Pain   Other Other (See Comments)    Beef Causes stomach pain   Tamiflu [Oseltamivir Phosphate]    Zofran [Ondansetron]     rash   Review of Systems: All systems reviewed and negative except where noted in HPI.   Physical Exam: BP 100/70 (BP Location: Left Arm, Patient Position: Sitting, Cuff Size: Normal)   Pulse 88   Ht '5\' 7"'$  (1.702 m) Comment: height measured without shoes  Wt 134 lb 6 oz (61 kg)   LMP 07/12/2011   BMI 21.05 kg/m  Constitutional: Pleasant,well-developed, female in no acute distress. HEENT: Normocephalic and atraumatic. Conjunctivae are normal. No scleral icterus. Cardiovascular: Normal rate, regular rhythm.  Pulmonary/chest: Effort normal and breath sounds normal. No wheezing, rales or rhonchi. Abdominal: Soft, nondistended, nontender. Bowel sounds active throughout. There are no masses palpable. No  hepatomegaly. Extremities: No edema Neurological: Alert and oriented to person place and time. Skin: Skin is warm and dry. No rashes noted. Psychiatric: Normal mood and affect. Behavior is normal.  Labs 04/2021: Positive Cologuard  Labs 06/2021: CMP nml, CBC nml. TSH nml.  EGD 09/09/21: - Normal esophagus. Biopsied. - A single papule (nodule) found in the stomach. Biopsied. - Gastritis. Biopsied. - A single duodenal polyp. Biopsied. - Biopsies were taken with a cold forceps for histology in the duodenal bulb and in the second portion of the duodenum. Path: 1. Surgical [P], duodenum -  DUODENAL MUCOSA WITH NO SPECIFIC HISTOPATHOLOGIC CHANGES - NEGATIVE FOR INCREASED INTRAEPITHELIAL LYMPHOCYTES OR VILLOUS ARCHITECTURAL CHANGES 2. Surgical [P], duodenal polyp, polyp (1) - POLYPOID DUODENAL MUCOSA WITH REACTIVE CHANGES. SEE NOTE - NEGATIVE FOR DYSPLASIA 2. Differential diagnosis can include benign Brunner's gland hyperplasia 3. Surgical [P], gastric - GASTRIC ANTRAL AND OXYNTIC MUCOSA WITH MILD CHRONIC GASTRITIS. SEE NOTE Immunohistochemical stain for Helicobacter pylori is negative. 4. Surgical [P], GE junction nodule, polyp (1) (nodule) - ESOPHAGEAL SQUAMOUS AND CARDIAC MUCOSA WITH INTESTINAL METAPLASIA. SEE NOTE - NEGATIVE FOR DYSPLASIA 4. The findings are consistent with Barretts esophagus in an appropriate clinical setting. 5. Surgical [P], random sites esophageal - ESOPHAGEAL SQUAMOUS MUCOSA WITH NO SPECIFIC HISTOPATHOLOGIC CHANGES - NEGATIVE FOR INCREASED INTRAEPITHELIAL EOSINOPHILS  Colonoscopy 09/09/21: - The examined portion of the ileum was normal. - Diverticulosis in the sigmoid colon. - Non-bleeding internal hemorrhoids. - No specimens collected.  ASSESSMENT AND PLAN: Gastritis Weight loss Constipation Nail dystrophy Intestinal metaplasia, either gastric or Barrett's Patient presents for follow up of constipation and weight loss. Her Linzess has helped with some of her abdominal discomfort but not as much with her BM frequency. Thus will go ahead and try her on a different constipation medication called Amitiza to see if this will be more effective. Also encouraged her to eat 2 kiwis per day. Patient would likely benefit from pelvic floor PT but is moving shortly so we will try to refer her to a PT in [place], [place] where she is moving. Okay to decrease her omeprazole from BID to QD due to possible Barrett's esophagus seen on her last EGD.  - Eat 2 kiwis per day - Okay to stop Linzess 290 mcg QD - Start Amitiza 24 mcg QD - Decrease omeprazole from 40 mg BID  to 40 mg QD - Patient states that she is going to have her thyroid levels checked soon.  - Referral to pelvic floor PT in [place], [place] - Patient will reach out to her dermatologist about nail dystrophy - RTC PRN since patient is moving to [place] in in about a week  Christia Reading, MD  I spent 43 minutes of time, including in depth chart review, independent review of results as outlined above, communicating results with the patient directly, face-to-face time with the patient, coordinating care, ordering studies and medications as appropriate, and documentation.

## 2022-01-12 NOTE — Patient Instructions (Signed)
If you are age 50 or older, your body mass index should be between 23-30. Your Body mass index is 21.05 kg/m. If this is out of the aforementioned range listed, please consider follow up with your Primary Care Provider.  If you are age 50 or younger, your body mass index should be between 19-25. Your Body mass index is 21.05 kg/m. If this is out of the aformentioned range listed, please consider follow up with your Primary Care Provider.   We will send a referral to Pelvic floor in [facility] and will call with an appointment.   Decrease Omeprazole to once daily.   Start eating two kiwi's daily.  Follow up as needed.   The [facility] providers would like to encourage you to use [facility] to communicate with providers for non-urgent requests or questions.  Due to long hold times on the telephone, sending your provider a message by [facility] may be a faster and more efficient way to get a response.  Please allow 48 business hours for a response.  Please remember that this is for non-urgent requests.  _______________________________________________________ Thank you for entrusting me with your care and for choosing [facility], Dr. Christia Reading

## 2022-01-16 LAB — TESTOS,TOTAL,FREE AND SHBG (FEMALE)
Free Testosterone: 4.4 pg/mL (ref 0.1–6.4)
Sex Hormone Binding: 76 nmol/L (ref 17–124)
Testosterone, Total, LC-MS-MS: 72 ng/dL — ABNORMAL HIGH (ref 2–45)

## 2022-01-19 ENCOUNTER — Other Ambulatory Visit: Payer: Self-pay

## 2022-01-19 ENCOUNTER — Telehealth: Payer: Self-pay | Admitting: *Deleted

## 2022-01-19 DIAGNOSIS — R6882 Decreased libido: Secondary | ICD-10-CM

## 2022-01-19 DIAGNOSIS — F5231 Female orgasmic disorder: Secondary | ICD-10-CM

## 2022-01-19 MED ORDER — ESTRADIOL 0.075 MG/24HR TD PTTW: 1.0000 | MEDICATED_PATCH | TRANSDERMAL | 1 refills | Status: DC

## 2022-01-19 MED ORDER — NONFORMULARY OR COMPOUNDED ITEM: 0 refills | Status: DC

## 2022-01-19 NOTE — Telephone Encounter (Signed)
Patient informed Rx called in to compound pharmacy. Patch sent to local pharmacy. Patient said she will be moving to [place] this week and she would like to have lab recheck done in [place]. Patient said she will call the office once she get this information.

## 2022-01-19 NOTE — Telephone Encounter (Signed)
I thought she went up on her dose after her last testosterone level. If she is using the 0.5 grams, I would recommend decreasing her to 0.25 grams. Then repeating her level in one month.   If she desires an increase in her patch, call in the vivelle dot 0.075 mg, #24, 1 refill.  Thanks

## 2022-01-19 NOTE — Telephone Encounter (Signed)
Patient informed.  Dr.Jertson when I went to reorder Rx under medication list it appears she is already on testosterone cream 1% 0.5 grams daily to lower abdomen. I called her pharmacy to double check and they have the same 1% testosterone cream, 0.5 grams to her lower abdomen daily last picked up in May. I wanted to confirm because you said charge her dose "back" to the above dose.    2. Patient mentioned she noticed increased hot flashes at night she asked if her patch should be increased?  Please advise

## 2022-01-19 NOTE — Telephone Encounter (Signed)
-----   Message from Salvadore Dom, MD sent at 01/19/2022  1:23 PM EDT ----- Please let the patient know that her testosterone level is a little high. Please change her dose back to 1% testosterone cream, 0.5 grams to her lower abdomen daily, #30 grams, no refills. Please repeat her blood work in one month [phone])

## 2022-01-24 ENCOUNTER — Encounter: Payer: Self-pay | Admitting: Internal Medicine

## 2022-01-24 ENCOUNTER — Telehealth: Payer: Self-pay

## 2022-01-24 ENCOUNTER — Other Ambulatory Visit: Payer: Self-pay

## 2022-01-24 ENCOUNTER — Other Ambulatory Visit: Payer: Self-pay | Admitting: Internal Medicine

## 2022-01-24 NOTE — Telephone Encounter (Signed)
Left VM for patient to return call so that I can send Amitiza to her new pharmacy.

## 2022-01-28 ENCOUNTER — Telehealth: Payer: Self-pay

## 2022-01-28 NOTE — Telephone Encounter (Signed)
Gabrielly Mowers to [facility] Clinical Pool (supporting Sharyn Creamer, MD)   CR    01/28/22  3:09 PM Thank you so much for your help. I was able to get the script filled and took my first dose with food this morning. About an hour later, I started noticing a tightening in my chest and it was uncomfortable to take a deep breath. I have mild asthma so I used my inhaler but it didn't help much.  It's been about 7 hrs now and it's mostly better but I still notice it if I try to take a really deep breath. I googled it at some point and realized this could be a side effect of the medication.  I'm pretty desperate for relief of my constipation so is this something that would go away after a few does? I can tough it out if that's the case. I never felt like I couldn't breathe but I couldn't take a deep breath.   Message received through Patient Message of My Chart. Attempted to contact the patient by phone. No answer. Left a voicemail instructing her Do Not Take the Amitiza until she hears back from [place].

## 2022-01-30 ENCOUNTER — Encounter: Payer: Self-pay | Admitting: Family Medicine

## 2022-01-31 ENCOUNTER — Encounter: Payer: Self-pay | Admitting: Internal Medicine

## 2022-02-09 ENCOUNTER — Encounter: Payer: Self-pay | Admitting: Family Medicine

## 2022-02-11 MED ORDER — BUPROPION HCL ER (XL) 300 MG PO TB24: 300.0000 mg | ORAL_TABLET | Freq: Every day | ORAL | 1 refills | Status: AC

## 2022-02-27 ENCOUNTER — Encounter: Payer: Self-pay | Admitting: Family Medicine

## 2022-02-28 NOTE — Telephone Encounter (Signed)
Please advise 

## 2022-02-28 NOTE — Telephone Encounter (Signed)
Ok to refill trazodone '100mg'$  nightly as needed for sleep.  [name]. [name], MD 02/28/2022 12:32 PM

## 2022-03-01 ENCOUNTER — Other Ambulatory Visit: Payer: Self-pay | Admitting: *Deleted

## 2022-03-01 NOTE — Telephone Encounter (Signed)
Left message to return call to our office at their convenience.   Need pharmacy of choice to send medication

## 2022-03-02 NOTE — Telephone Encounter (Signed)
Patient returned call.  Patient states Pharmacy of choice is [facility] located at [address] in [place], [place]

## 2022-03-03 ENCOUNTER — Other Ambulatory Visit: Payer: Self-pay | Admitting: *Deleted

## 2022-03-03 MED ORDER — TRAZODONE HCL 100 MG PO TABS: 100.0000 mg | ORAL_TABLET | Freq: Every day | ORAL | 0 refills | Status: DC

## 2022-03-03 NOTE — Telephone Encounter (Signed)
Rx send to pharmacy  

## 2022-03-29 ENCOUNTER — Encounter: Payer: Self-pay | Admitting: Obstetrics and Gynecology

## 2022-04-04 ENCOUNTER — Encounter: Payer: Self-pay | Admitting: *Deleted

## 2022-04-25 ENCOUNTER — Telehealth: Payer: Self-pay | Admitting: Family Medicine

## 2022-04-25 ENCOUNTER — Ambulatory Visit: Payer: Self-pay | Admitting: Family Medicine

## 2022-04-25 NOTE — Telephone Encounter (Signed)
Pt states: -Possibly dealing with long COVID-19 -04/22/22 onset of fatigue, weakness -symptoms intensified through the weekend -04/24/22 evening: additional symptoms of SOB (not smooth inhale), dizziness, and inhaler ineffective, nausea   Warm transfer to Suanne Marker, patient coordinator, with the PCP [place] / Team Health Nurse.  Awaiting follow up notes

## 2022-04-25 NOTE — Telephone Encounter (Signed)
Caller is Barista, team health nurse   Caller states: -She recommended patient go to ED per her symptoms  - Pt informed her that she did not want to go to the ED and wanted to keep her OV for 10/16  - Per chest pain guidelines, tightness doesn't appear to be a heavy pressure but accompanied nausea raises concern   I informed Dr. [name] of triage's recommendation and he agreed.   I called and informed patient of PCP's recommendation. Pt verbalized understanding. OV for 10/16 has been canceled.

## 2022-04-25 NOTE — Telephone Encounter (Signed)
Patient Name: LIZETTE PAZOS Gender: Female DOB: 03-27-1971 Age: 51 Y 1 M 16 D Return Phone Number: [phone] (Primary) Address: City/ State/ Zip: [place]  [address] Client [facility] Client Site [facility] Provider Dimas Chyle- MD Contact Type Call Who Is Calling Patient / Member / Family / Caregiver Call Type Triage / Clinical Relationship To Patient Self Return Phone Number [phone] (Primary) Chief Complaint CHEST PAIN - pain, pressure, heaviness or tightness Reason for Call Symptomatic / Request for [place] states chest tightness, weakness, fatigue and nausea; Translation No Nurse Assessment Nurse: Susy Manor, RN, Megan Date/Time (Eastern Time): 04/25/2022 9:43:49 AM Confirm and document reason for call. If symptomatic, describe symptoms. ---Caller states chest tightness, weakness, fatigue and nausea. Sx started Friday. She has been dealing with long covid for a year and half. Sx are worse than normal. She couldn't take a full deep breath yesterday, she used inhaler. Denies SOB. Chest feels sore from where she was trying to get a deep breath. Does the patient have any new or worsening symptoms? ---Yes Will a triage be completed? ---Yes Related visit to physician within the last 2 weeks? ---No Does the PT have any chronic conditions? (i.e. diabetes, asthma, this includes High risk factors for pregnancy, etc.) ---Yes List chronic conditions. ---asthma, long covid, thyroid removed Is the patient pregnant or possibly pregnant? (Ask all females between the ages of 13-55) ---No Is this a behavioral health or substance abuse call? ---No Guidelines Guideline Title Affirmed Question Affirmed Notes Nurse Date/Time (Eastern Time) Chest Pain History of prior "blood clot" in leg Susy Manor, RN, Megan  04/25/2022 9:47:12 AM   Guidelines Guideline Title  Affirmed Question Affirmed Notes Nurse Date/Time Eilene Ghazi Time) or lungs (i.e., deep vein thrombosis, pulmonary embolism) Disp. Time Eilene Ghazi Time) Disposition Final User 04/25/2022 9:42:41 AM Send to Urgent Queue Jerrye Beavers 04/25/2022 9:51:31 AM Go to ED Now Yes Susy Manor, RN, Megan Final Disposition 04/25/2022 9:51:31 AM Go to ED Now Yes Susy Manor, RN, Delphia Grates Disagree/Comply Disagree Caller Understands Yes PreDisposition Call Doctor Care Advice Given Per Guideline GO TO ED NOW: * You need to be seen in the Emergency Department. * Go to the ED at ___________ [facility] given per Chest Pain (Adult) guideline. Comments User: Sula Soda, RN Date/Time Eilene Ghazi Time): 04/25/2022 9:52:04 AM Caller requesting to make an appointment with her doctor User: Sula Soda, RN Date/Time Eilene Ghazi Time): 04/25/2022 9:54:58 AM I called office backline and gave report. Referrals GO TO FACILITY REFUSED

## 2022-04-27 NOTE — Telephone Encounter (Signed)
Patient denied ED at this time

## 2022-06-23 ENCOUNTER — Encounter: Payer: Self-pay | Admitting: *Deleted

## 2022-07-01 DIAGNOSIS — K2270 Barrett's esophagus without dysplasia: Secondary | ICD-10-CM | POA: Insufficient documentation

## 2022-07-01 DIAGNOSIS — K219 Gastro-esophageal reflux disease without esophagitis: Secondary | ICD-10-CM | POA: Insufficient documentation

## 2022-07-01 DIAGNOSIS — U099 Post covid-19 condition, unspecified: Secondary | ICD-10-CM | POA: Insufficient documentation

## 2022-07-03 DIAGNOSIS — M5030 Other cervical disc degeneration, unspecified cervical region: Secondary | ICD-10-CM | POA: Insufficient documentation

## 2022-07-09 ENCOUNTER — Other Ambulatory Visit: Payer: Self-pay | Admitting: Family Medicine

## 2022-07-09 ENCOUNTER — Other Ambulatory Visit: Payer: Self-pay | Admitting: Obstetrics and Gynecology

## 2022-07-09 DIAGNOSIS — Z79890 Hormone replacement therapy (postmenopausal): Secondary | ICD-10-CM

## 2022-07-12 NOTE — Telephone Encounter (Signed)
Last AEX 07/20/2021--nothing scheduled Last mammo 07/29/2021-neg birads 1--nothing scheduled.  Will send msg to pt.

## 2022-07-12 NOTE — Telephone Encounter (Signed)
[name], Carrie  Rahim Astorga D, CMA Left message for patient to call and schedule appointment. 

## 2022-07-18 ENCOUNTER — Other Ambulatory Visit: Payer: Self-pay

## 2022-07-18 NOTE — Telephone Encounter (Signed)
Pt scheduled AEX for 10/05/2022.

## 2022-07-18 NOTE — Telephone Encounter (Addendum)
Message from front desk "Patient needs refill aex sch for 10/05/22."  Last AEX 07/20/2021  I left message for patient to call and advise what Rx she needs refilled.

## 2022-07-18 NOTE — Telephone Encounter (Signed)
Please refer to refill request from 07/09/22. Will close this encounter.

## 2022-08-09 ENCOUNTER — Other Ambulatory Visit: Payer: Self-pay | Admitting: Family Medicine

## 2022-08-31 ENCOUNTER — Encounter: Payer: Self-pay | Admitting: Obstetrics and Gynecology

## 2022-09-01 NOTE — Telephone Encounter (Signed)
Screening mammo order faxed successfully.  FYI. [place] contacted [place] back regarding mammo order and inquired if the pt needed an [place] also. I advised them that diagnostic imaging was not necessary at this time unless her screening were to return inconclusive. She may require 3D/tomo due to density. However, her last screening mammogram in 07/2021 was WNL. They voiced understanding.

## 2022-09-01 NOTE — Telephone Encounter (Signed)
Last AEX 07/20/2021--scheduled for 10/05/2022. Last mammo 07/29/2021-neg birads 1  Order placed on your desk for authorization.

## 2022-09-28 NOTE — Progress Notes (Signed)
52 y.o. [id number] Married White or Caucasian Not Hispanic or Latino female here for annual exam.  H/O endometrial ablation. On HRT for severe vasomotor symptoms. She has tolerable vasomotor symptoms. No vaginal bleeding. No dyspareunia.  She was on topical testosterone. [facility] is managing her testosterone. Helping.    She has long covid. She saw a Rheumatologist for an elevated ANA, his evaluation of her was negative. Improved with naltrexone.   No bowel or bladder c/o.   Patient's last menstrual period was 07/12/2011.          Sexually active: Yes.    The current method of family planning is tubal ligation and post menopausal status.    Exercising: Yes.     Yoga and walking  Smoker:  no  Health Maintenance: Pap:  07/20/21 WNL Hr HPV neg 04/22/19 wnl Hr HPV neg History of abnormal Pap:  Yes: 2/17 ASCUS, +HPV 3/17 Colpo, CIN I 3/18 ASC-H, +HPV 4/18 Colpo no dysplasia. Co testing recommended in 12 and 24 months. 5/19 Negative pap, negative hpv  MMG:  07/29/21 bi-rads 1 neg, scheduled for May.  BMD:   n/a Colonoscopy: 09/09/21 normal f/u 10 years  Endoscopy in 3/23, recommended f/u in 1 year. Will schedule for this summer.  TDaP:  utd per patient  Gardasil: n/a   reports that she has never smoked. She has never used smokeless tobacco. She reports current alcohol use. She reports that she does not use drugs. 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 Kids are grown. No grandchildren.   Past Medical History:  Diagnosis Date   Amenorrhea    Arthritis    Asthma    Colon polyps    COVID-19 long hauler    Depression    Frequent headaches    Frequent refractory urinary tract infections    Hay fever    History of chicken pox    Superficial thrombophlebitis    Post partum   Thyroid disease     Past Surgical History:  Procedure Laterality Date   ADENOIDECTOMY  1975   BREAST BIOPSY  1995   BREAST EXCISIONAL BIOPSY     CERVICAL DISCECTOMY  2003   CHOLECYSTECTOMY   2013   COLPOSCOPY     ENDOMETRIAL ABLATION     THYROIDECTOMY  2018   TUBAL LIGATION      Current Outpatient Medications  Medication Sig Dispense Refill   albuterol (VENTOLIN HFA) 108 (90 Base) MCG/ACT inhaler Inhale 2 puffs into the lungs every 4 (four) hours as needed for shortness of breath. 8 g 2   ARMOUR THYROID 120 MG tablet TAKE 1 TABLET (120 MG TOTAL) BY MOUTH DAILY. 90 tablet 0   buPROPion (WELLBUTRIN XL) 300 MG 24 hr tablet Take 1 tablet (300 mg total) by mouth daily. 90 tablet 1   estradiol (VIVELLE-DOT) 0.075 MG/24HR Place 1 patch onto the skin 2 (two) times a week. 24 patch 1   Magnesium 100 MG CAPS      Multiple Vitamin (MULTIVITAMIN) tablet Take 1 tablet by mouth daily.     NALTREXONE HCL PO Take by mouth.     NONFORMULARY OR COMPOUNDED ITEM Testosterone 1% cream apply 0.25 grams daily to her lower abdomen 30 each 0   omeprazole (PRILOSEC) 40 MG capsule Take 1 capsule (40 mg total) by mouth daily. 30 capsule 11   progesterone (PROMETRIUM) 100 MG capsule TAKE 1 CAPSULE BY MOUTH EVERY DAY 90 capsule 0   SUMAtriptan (IMITREX) 100 MG tablet Take 1 tablet (  100 mg total) by mouth daily as needed. 10 tablet 0   No current facility-administered medications for this visit.    Family History  Problem Relation Age of Onset   Depression Mother    Pancreatitis Mother    Alcohol abuse Father    Depression Father    Diabetes Father    Early death Father    Heart attack Father    Heart disease Father    Pancreatitis Father    Depression Sister    Hyperlipidemia Sister    Mental illness Sister    Depression Sister    Depression Sister    Depression Maternal Grandmother    Early death Maternal Grandmother    Hyperlipidemia Maternal Grandmother    Hypertension Maternal Grandmother    Stroke Maternal Grandmother    Learning disabilities Maternal Grandmother    Depression Maternal Grandfather    Early death Maternal Grandfather    Alcohol abuse Maternal Grandfather     Learning disabilities Maternal Grandfather    Epilepsy Maternal Grandfather    Suicidality Maternal Grandfather    Arthritis Paternal Grandmother    Cancer Paternal Grandmother    Hearing loss Paternal Grandmother    Breast cancer Paternal Grandmother    Alcohol abuse Paternal Grandfather    Hearing loss Paternal Grandfather    Heart disease Paternal Grandfather    Mental illness Daughter    Migraines Daughter    Colon cancer Neg Hx    Esophageal cancer Neg Hx    Stomach cancer Neg Hx    Rectal cancer Neg Hx     Review of Systems  All other systems reviewed and are negative.   Exam:   BP 128/82   Pulse 74   Ht 5\' 9"  (1.753 m)   Wt 145 lb (65.8 kg)   LMP 07/12/2011   SpO2 100%   BMI 21.41 kg/m   Weight change: @WEIGHTCHANGE @ Height:   Height: 5\' 9"  (175.3 cm)  Ht Readings from Last 3 Encounters:  10/05/22 5\' 9"  (1.753 m)  01/12/22 5\' 7"  (1.702 m)  11/10/21 5\' 9"  (1.753 m)    General appearance: alert, cooperative and appears stated age Head: Normocephalic, without obvious abnormality, atraumatic Neck: no adenopathy, supple, symmetrical, trachea midline and thyroid normal to inspection and palpation Lungs: clear to auscultation bilaterally Cardiovascular: regular rate and rhythm Breasts: normal appearance, no masses or tenderness Abdomen: soft, non-tender; non distended,  no masses,  no organomegaly Extremities: extremities normal, atraumatic, no cyanosis or edema Skin: Skin color, texture, turgor normal. No rashes or lesions Lymph nodes: Cervical, supraclavicular, and axillary nodes normal. No abnormal inguinal nodes palpated Neurologic: Grossly normal   Pelvic: External genitalia:  no lesions              Urethra:  normal appearing urethra with no masses, tenderness or lesions              Bartholins and Skenes: normal                 Vagina: normal appearing vagina with normal color and discharge, no lesions              Cervix: no lesions                Bimanual Exam:  Uterus:  normal size, contour, position, consistency, mobility, non-tender              Adnexa: no mass, fullness, tenderness  Rectovaginal: Confirms               Anus:  normal sphincter tone, no lesions  Gae Dry, CMA chaperoned for the exam.  1. Well woman exam Discussed breast self exam Discussed calcium and vit D intake Mammogram scheduled Colonoscopy UTD Labs with primary  2. Hormone replacement therapy (HRT) Aware of risks, wants to continue.  - estradiol (VIVELLE-DOT) 0.075 MG/24HR; Place 1 patch onto the skin 2 (two) times a week.  Dispense: 24 patch; Refill: 3 - progesterone (PROMETRIUM) 100 MG capsule; Take 1 capsule (100 mg total) by mouth daily.  Dispense: 90 capsule; Refill: 3

## 2022-10-05 ENCOUNTER — Ambulatory Visit (INDEPENDENT_AMBULATORY_CARE_PROVIDER_SITE_OTHER): Payer: BC Managed Care – PPO | Admitting: Obstetrics and Gynecology

## 2022-10-05 ENCOUNTER — Encounter: Payer: Self-pay | Admitting: Obstetrics and Gynecology

## 2022-10-05 VITALS — BP 128/82 | HR 74 | Ht 69.0 in | Wt 145.0 lb

## 2022-10-05 DIAGNOSIS — Z01419 Encounter for gynecological examination (general) (routine) without abnormal findings: Secondary | ICD-10-CM | POA: Diagnosis not present

## 2022-10-05 DIAGNOSIS — Z79890 Hormone replacement therapy (postmenopausal): Secondary | ICD-10-CM | POA: Diagnosis not present

## 2022-10-05 MED ORDER — ESTRADIOL 0.075 MG/24HR TD PTTW: 1.0000 | MEDICATED_PATCH | TRANSDERMAL | 3 refills | Status: DC

## 2022-10-05 MED ORDER — PROGESTERONE MICRONIZED 100 MG PO CAPS: 100.0000 mg | ORAL_CAPSULE | Freq: Every day | ORAL | 3 refills | Status: DC

## 2022-10-05 NOTE — Patient Instructions (Signed)

## 2022-10-09 IMAGING — MG MM DIGITAL SCREENING BILAT W/ TOMO AND CAD
6 of 10 series · 6 of 30 positions shown · non-contrast
Comparison: Previous exam(s).

CLINICAL DATA: Screening.

EXAM:
DIGITAL SCREENING BILATERAL MAMMOGRAM WITH TOMOSYNTHESIS AND CAD
TECHNIQUE: Bilateral screening digital craniocaudal and mediolateral oblique
mammograms were obtained. Bilateral screening digital breast
tomosynthesis was performed. The images were evaluated with
computer-aided detection.

[R MLO synth-2D (1 of 2)]
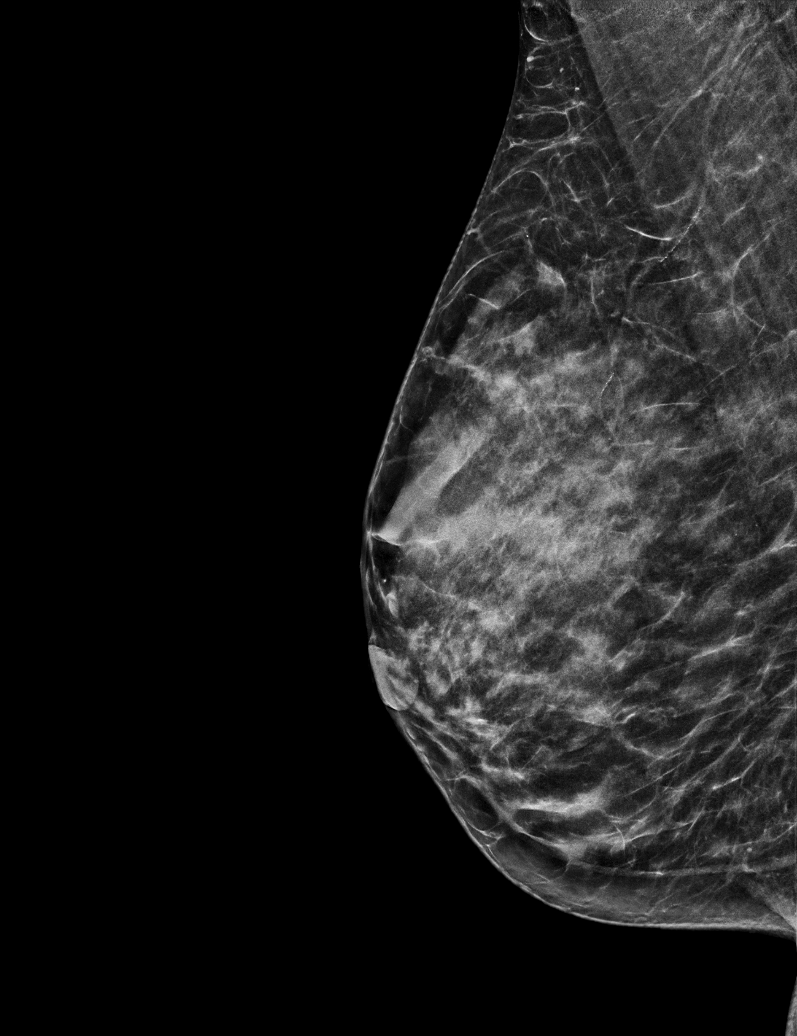

[R MLO synth-2D (2 of 2)]
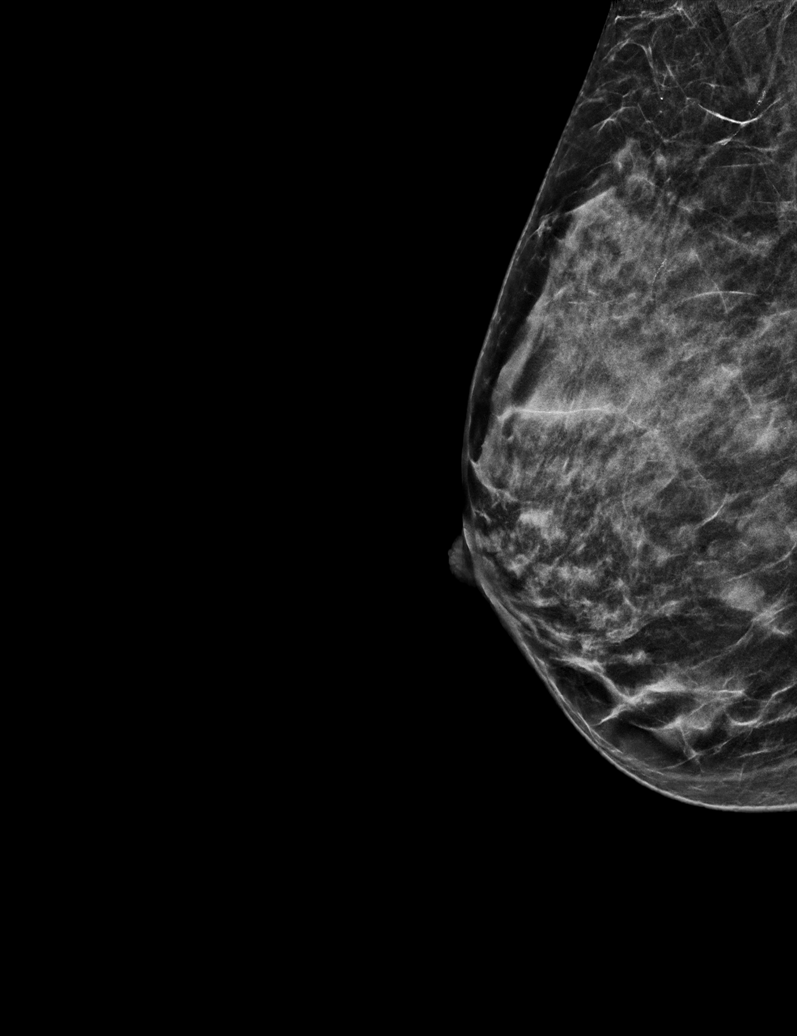

[L MLO synth-2D]
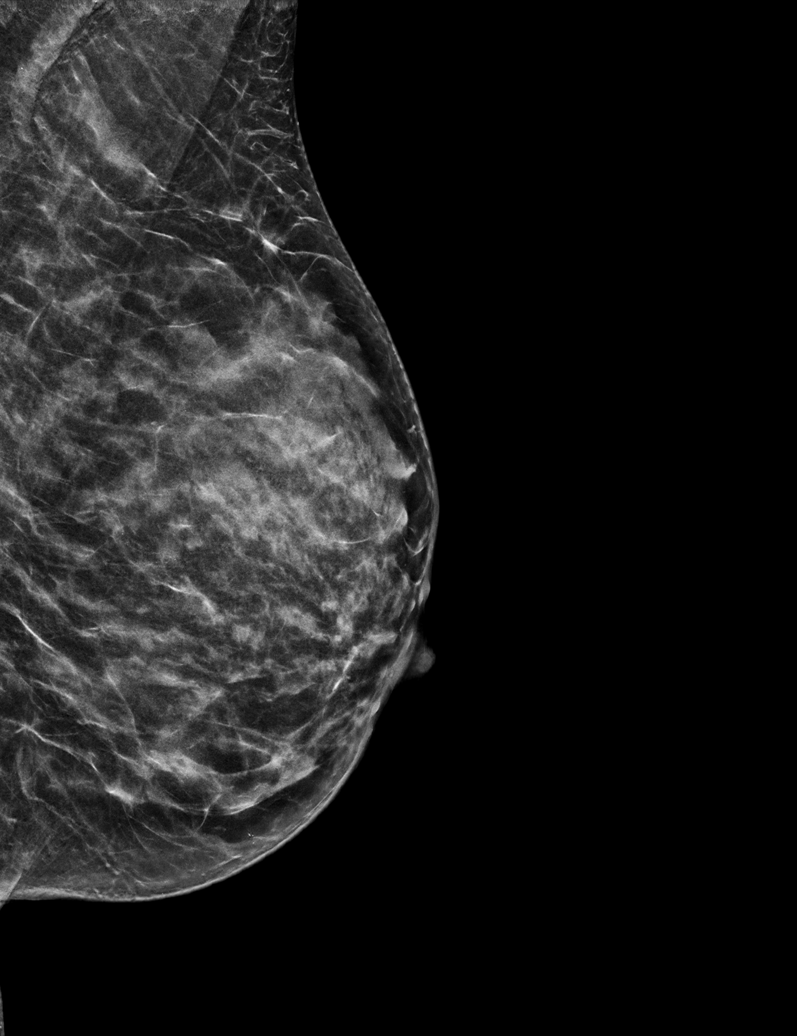

[R CC synth-2D]
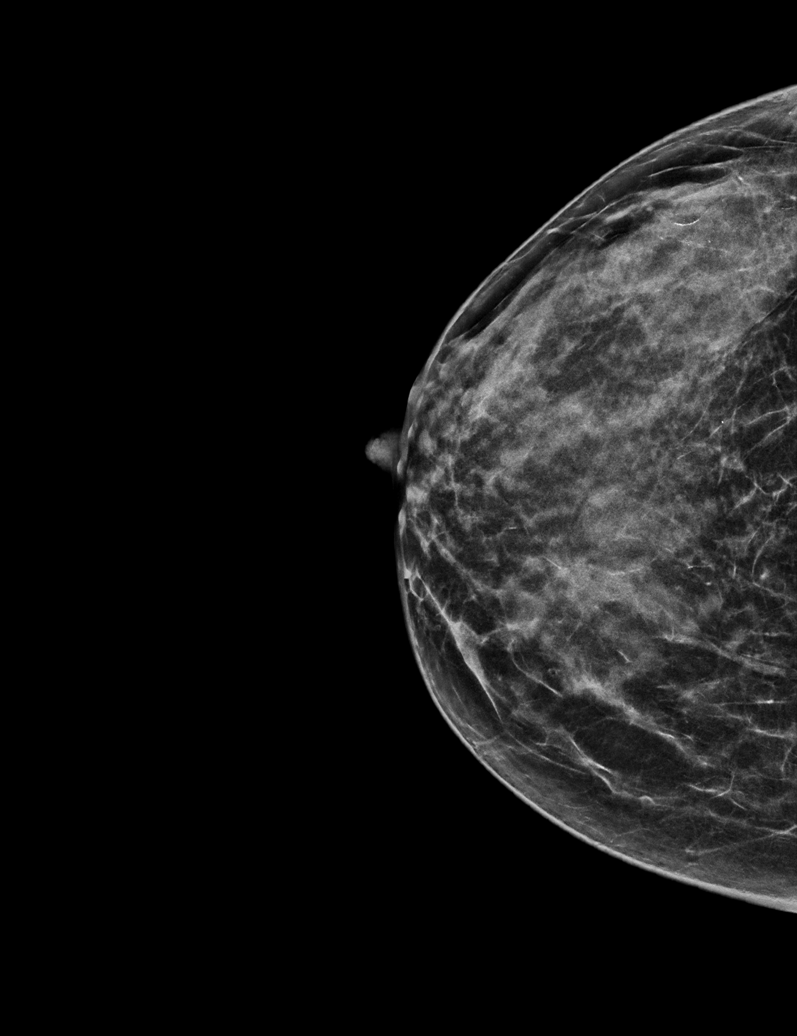

[L CC synth-2D]
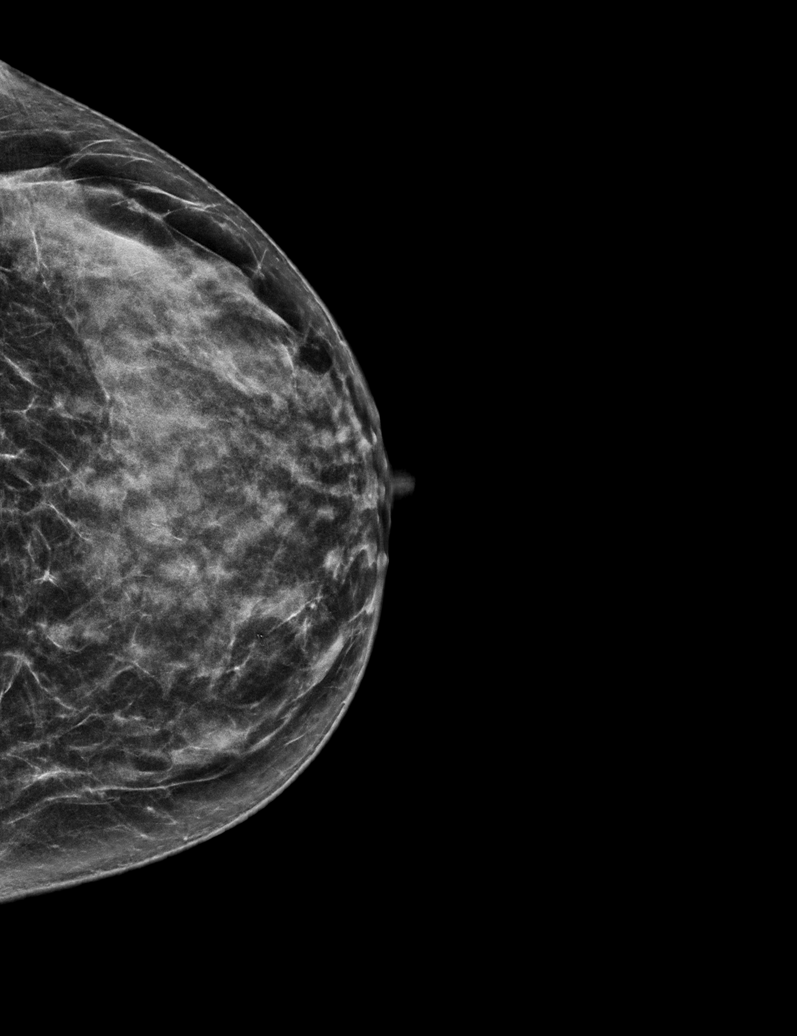

[L MLO tomo · tomo slice 25/50.0]
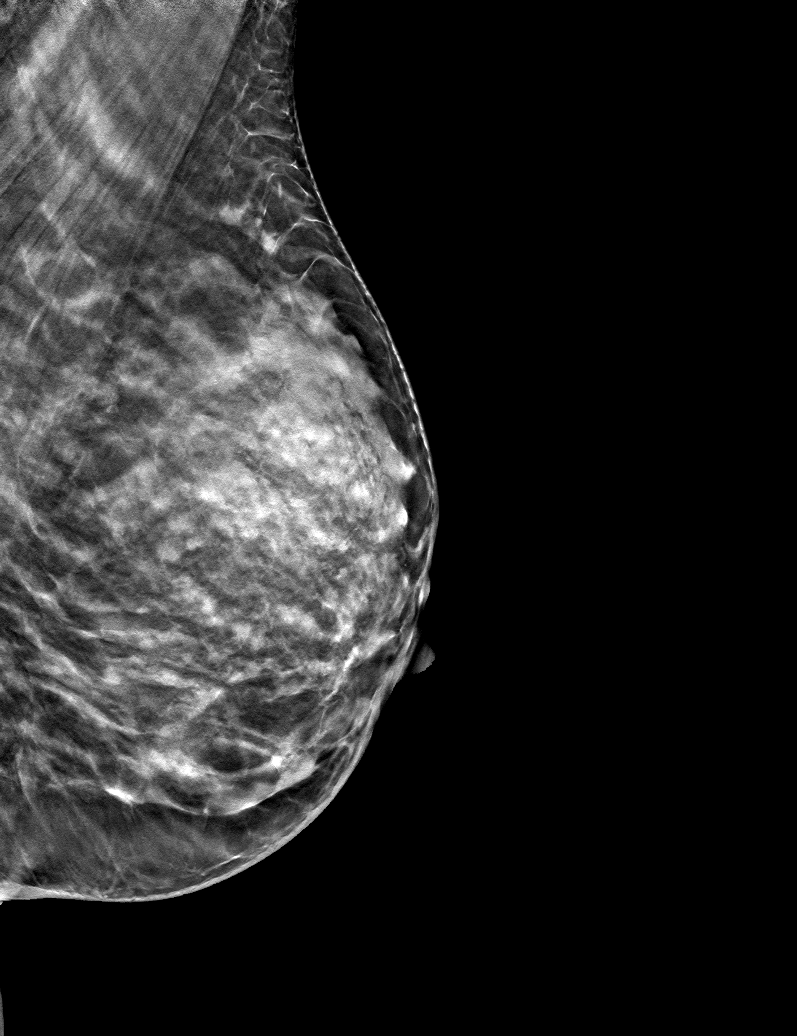

[6 of 30 positions shown; findings below may reference images not displayed]

ACR Breast Density Category d: The breast tissue is extremely dense,
which lowers the sensitivity of mammography
FINDINGS: There are no findings suspicious for malignancy.
IMPRESSION: No mammographic evidence of malignancy. A result letter of this
screening mammogram will be mailed directly to the patient.

RECOMMENDATION:
Screening mammogram in one year. (Code:TA-V-WV9)

BI-RADS CATEGORY  1: Negative.

## 2022-12-14 ENCOUNTER — Encounter: Payer: Self-pay | Admitting: Internal Medicine

## 2022-12-14 NOTE — Telephone Encounter (Signed)
Dr Leonides Schanz- Is it okay to schedule this patient for direct endoscopy? 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

## 2022-12-20 ENCOUNTER — Encounter: Payer: Self-pay | Admitting: Internal Medicine

## 2023-01-04 ENCOUNTER — Ambulatory Visit (AMBULATORY_SURGERY_CENTER): Payer: BC Managed Care – PPO

## 2023-01-04 VITALS — Ht 69.0 in | Wt 135.0 lb

## 2023-01-04 DIAGNOSIS — R1310 Dysphagia, unspecified: Secondary | ICD-10-CM

## 2023-01-04 DIAGNOSIS — K31A0 Gastric intestinal metaplasia, unspecified: Secondary | ICD-10-CM

## 2023-01-04 NOTE — Progress Notes (Signed)
Pre visit completed via phone call; Patient verified name, DOB, and address;  No egg or soy allergy known to patient;  No issues known to pt with past sedation with any surgeries or procedures; Patient denies ever being told they had issues or difficulty with intubation;  No FH of Malignant Hyperthermia; Pt is not on diet pills; Pt is not on home 02; Pt is not on blood thinners;  Pt reports issues with constipation- patient advised No A fib or A flutter; Have any cardiac testing pending--NO Pt instructed to use Singlecare[employer]com or GoodRx for a price reduction on prep;  Insurance verified during PV appt=[employer]  Patient's chart reviewed by Cathlyn Parsons CNRA prior to previsit and patient appropriate for the [facility].  Previsit completed and red dot placed by patient's name on their procedure day (on provider's schedule).    Instructions sent to patient via MyChart per patient's request;

## 2023-01-18 ENCOUNTER — Encounter: Payer: Self-pay | Admitting: Internal Medicine

## 2023-01-24 ENCOUNTER — Encounter: Payer: Self-pay | Admitting: Internal Medicine

## 2023-01-24 ENCOUNTER — Ambulatory Visit: Payer: BC Managed Care – PPO | Admitting: Internal Medicine

## 2023-01-24 VITALS — BP 129/78 | HR 78 | Temp 98.0°F | Resp 26 | Ht 69.0 in | Wt 135.0 lb

## 2023-01-24 DIAGNOSIS — K3189 Other diseases of stomach and duodenum: Secondary | ICD-10-CM

## 2023-01-24 DIAGNOSIS — K2289 Other specified disease of esophagus: Secondary | ICD-10-CM

## 2023-01-24 DIAGNOSIS — K2950 Unspecified chronic gastritis without bleeding: Secondary | ICD-10-CM

## 2023-01-24 DIAGNOSIS — R1310 Dysphagia, unspecified: Secondary | ICD-10-CM

## 2023-01-24 MED ORDER — SODIUM CHLORIDE 0.9 % IV SOLN
500.0000 mL | Freq: Once | INTRAVENOUS | Status: DC
Start: 2023-01-24 — End: 2023-01-24

## 2023-01-24 NOTE — Progress Notes (Signed)
 Called to room to assist during endoscopic procedure.  Patient ID and intended procedure confirmed with present staff. Received instructions for my participation in the procedure from the performing physician.  

## 2023-01-24 NOTE — Progress Notes (Signed)
 Pt's states no medical or surgical changes since previsit or office visit. 

## 2023-01-24 NOTE — Progress Notes (Signed)
 A and O x3. Report to RN. Tolerated MAC anesthesia well.Teeth unchanged after procedure.

## 2023-01-24 NOTE — Op Note (Signed)
[facility] Patient Name: Carrie [name] Procedure Date: 01/24/2023 9:47 AM MRN: [id number] Endoscopist: Particia Lather , , [id number] Age: 52 Referring MD:  Date of Birth: [birth year] Gender: Female Account #: [id number] Procedure:                Upper GI endoscopy Indications:              Follow-up of intestinal metaplasia Medicines:                Monitored Anesthesia Care Procedure:                Pre-Anesthesia Assessment:                           - Prior to the procedure, a History and Physical                            was performed, and patient medications and                            allergies were reviewed. The patient's tolerance of                            previous anesthesia was also reviewed. The risks                            and benefits of the procedure and the sedation                            options and risks were discussed with the patient.                            All questions were answered, and informed consent                            was obtained. Prior Anticoagulants: The patient has                            taken no anticoagulant or antiplatelet agents. ASA                            Grade Assessment: II - A patient with mild systemic                            disease. After reviewing the risks and benefits,                            the patient was deemed in satisfactory condition to                            undergo the procedure.                           After obtaining informed consent, the endoscope was  passed under direct vision. Throughout the                            procedure, the patient's blood pressure, pulse, and                            oxygen saturations were monitored continuously. The                            GIF [id number] #[id number] was introduced through the                            mouth, and advanced to the second part of duodenum.                            The upper GI  endoscopy was accomplished without                            difficulty. The patient tolerated the procedure                            well. Scope In: Scope Out: Findings:                 Localized mucosal changes characterized by                            sloughing were found in the proximal esophagus.                            Biopsies were taken with a cold forceps for                            histology.                           Salmon-colored mucosa was present. The maximum                            longitudinal extent of these esophageal mucosal                            changes was 1 cm in length. Mucosa was biopsied                            with a cold forceps for histology. One specimen                            bottle was sent to pathology.                           Biopsies were taken with a cold forceps in the                            gastric body, at the incisura and in the  gastric                            antrum for histology.                           The examined duodenum was normal. Complications:            No immediate complications. Estimated Blood Loss:     Estimated blood loss was minimal. Impression:               - Mucosal changes in the esophagus. Biopsied.                           - Salmon-colored mucosa. Biopsied.                           - Normal examined duodenum.                           - Biopsies were taken with a cold forceps for                            histology in the gastric body, at the incisura and                            in the gastric antrum. Recommendation:           - Discharge patient to home (with escort).                           - Await pathology results.                           - Trial of omeprazole 40 mg BID for 4 weeks,                            followed by QD.                           - The findings and recommendations were discussed                            with the patient. Dr Particia Lather  [name],  01/24/2023 10:16:53 AM

## 2023-01-24 NOTE — Progress Notes (Signed)
GASTROENTEROLOGY PROCEDURE H&P NOTE   Primary Care Physician: Ardith Dark, MD    Reason for Procedure:   Intestinal metaplasia  Plan:    EGD  Patient is appropriate for endoscopic procedure(s) in the ambulatory ([facility]) setting.  The nature of the procedure, as well as the risks, benefits, and alternatives were carefully and thoroughly reviewed with the patient. Ample time for discussion and questions allowed. The patient understood, was satisfied, and agreed to proceed.     HPI: Carrie [name] is a 52 y.o. female who presents for EGD for evaluation of intestinal metaplasia .  Patient was most recently seen in the Gastroenterology Clinic on 01/12/22.  No interval change in medical history since that appointment. Please refer to that note for full details regarding GI history and clinical presentation.   Past Medical History:  Diagnosis Date   Amenorrhea    Arthritis    generalized   Asthma    Colon polyps    COVID-19 long hauler    Depression    Frequent headaches    Frequent refractory urinary tract infections    Hay fever    History of chicken pox    Right leg DVT (HCC) 1999   behind right knee   Seasonal allergies    Superficial thrombophlebitis    Post partum   Thyroid disease    on meds    Past Surgical History:  Procedure Laterality Date   ADENOIDECTOMY  1975   BREAST BIOPSY  1995   BREAST EXCISIONAL BIOPSY     CERVICAL DISCECTOMY  2003   CHOLECYSTECTOMY  2013   COLPOSCOPY     ENDOMETRIAL ABLATION     ESOPHAGOGASTRODUODENOSCOPY  2023   YD   THYROIDECTOMY  2018   TUBAL LIGATION      Prior to Admission medications   Medication Sig Start Date End Date Taking? Authorizing Provider  albuterol (VENTOLIN HFA) 108 (90 Base) MCG/ACT inhaler Inhale 2 puffs into the lungs every 4 (four) hours as needed for shortness of breath. 01/18/19   Withrow, Everardo All, FNP  ARMOUR THYROID 120 MG tablet TAKE 1 TABLET (120 MG TOTAL) BY MOUTH DAILY. Patient not taking:  Reported on 01/04/2023 05/01/20   Ardith Dark, MD  buPROPion (WELLBUTRIN XL) 300 MG 24 hr tablet Take 1 tablet (300 mg total) by mouth daily. 02/11/22   Ardith Dark, MD  cyclobenzaprine (FLEXERIL) 10 MG tablet Take 10 mg by mouth 3 (three) times daily as needed. 01/01/23   [provider]  EMGALITY 120 MG/ML SOAJ Inject 1 Dose into the skin every 28 (twenty-eight) days. 10/11/22   [provider]  estradiol (VIVELLE-DOT) 0.075 MG/24HR Place 1 patch onto the skin 2 (two) times a week. 10/06/22   Romualdo Bolk, MD  Magnesium 100 MG CAPS Take 1 capsule by mouth daily.    [provider]  Multiple Vitamin (MULTIVITAMIN) tablet Take 1 tablet by mouth daily.    [provider]  NALTREXONE HCL PO Take 3 mg by mouth daily.    [provider]  NONFORMULARY OR COMPOUNDED ITEM Testosterone 1% cream apply 0.25 grams daily to her lower abdomen Patient taking differently: Apply 1 Application topically daily. Testosterone 1% cream apply 0.25 grams daily to her lower abdomen 01/19/22   Romualdo Bolk, MD  omeprazole (PRILOSEC) 40 MG capsule Take 1 capsule (40 mg total) by mouth daily. 01/12/22   Imogene Burn, MD  progesterone (PROMETRIUM) 100 MG capsule Take 1 capsule (100  mg total) by mouth daily. 10/05/22   Romualdo Bolk, MD  SUMAtriptan (IMITREX) 100 MG tablet Take 1 tablet (100 mg total) by mouth daily as needed. 06/05/18   Ardith Dark, MD  thyroid (ARMOUR) 90 MG tablet Take 90 mg by mouth daily. 120 mg x 5 days in a week and 90 mg the other 2 days    [provider]    Current Outpatient Medications  Medication Sig Dispense Refill   albuterol (VENTOLIN HFA) 108 (90 Base) MCG/ACT inhaler Inhale 2 puffs into the lungs every 4 (four) hours as needed for shortness of breath. 8 g 2   ARMOUR THYROID 120 MG tablet TAKE 1 TABLET (120 MG TOTAL) BY MOUTH DAILY. (Patient not taking: Reported on 01/04/2023) 90 tablet 0   buPROPion (WELLBUTRIN  XL) 300 MG 24 hr tablet Take 1 tablet (300 mg total) by mouth daily. 90 tablet 1   cyclobenzaprine (FLEXERIL) 10 MG tablet Take 10 mg by mouth 3 (three) times daily as needed.     EMGALITY 120 MG/ML SOAJ Inject 1 Dose into the skin every 28 (twenty-eight) days.     estradiol (VIVELLE-DOT) 0.075 MG/24HR Place 1 patch onto the skin 2 (two) times a week. 24 patch 3   Magnesium 100 MG CAPS Take 1 capsule by mouth daily.     Multiple Vitamin (MULTIVITAMIN) tablet Take 1 tablet by mouth daily.     NALTREXONE HCL PO Take 3 mg by mouth daily.     NONFORMULARY OR COMPOUNDED ITEM Testosterone 1% cream apply 0.25 grams daily to her lower abdomen (Patient taking differently: Apply 1 Application topically daily. Testosterone 1% cream apply 0.25 grams daily to her lower abdomen) 30 each 0   omeprazole (PRILOSEC) 40 MG capsule Take 1 capsule (40 mg total) by mouth daily. 30 capsule 11   progesterone (PROMETRIUM) 100 MG capsule Take 1 capsule (100 mg total) by mouth daily. 90 capsule 3   SUMAtriptan (IMITREX) 100 MG tablet Take 1 tablet (100 mg total) by mouth daily as needed. 10 tablet 0   thyroid (ARMOUR) 90 MG tablet Take 90 mg by mouth daily. 120 mg x 5 days in a week and 90 mg the other 2 days     No current facility-administered medications for this visit.    Allergies as of 01/24/2023 - Review Complete 01/04/2023  Allergen Reaction Noted   Gluten meal  04/13/2018   Oseltamivir Other (See Comments) 09/30/2016   Other Other (See Comments) 08/07/2017   Tamiflu [oseltamivir phosphate]  04/13/2018   Zofran [ondansetron]  04/13/2018    Family History  Problem Relation Age of Onset   Depression Mother    Pancreatitis Mother    Alcohol abuse Father    Depression Father    Diabetes Father    Early death Father    Heart attack Father    Heart disease Father    Pancreatitis Father    Depression Sister    Hyperlipidemia Sister    Mental illness Sister    Depression Sister    Depression Sister     Depression Maternal Grandmother    Early death Maternal Grandmother    Hyperlipidemia Maternal Grandmother    Hypertension Maternal Grandmother    Stroke Maternal Grandmother    Learning disabilities Maternal Grandmother    Depression Maternal Grandfather    Early death Maternal Grandfather    Alcohol abuse Maternal Grandfather    Learning disabilities Maternal Grandfather    Epilepsy Maternal Grandfather  Suicidality Maternal Grandfather    Arthritis Paternal Grandmother    Cancer Paternal Grandmother    Hearing loss Paternal Grandmother    Breast cancer Paternal Grandmother    Alcohol abuse Paternal Grandfather    Hearing loss Paternal Grandfather    Heart disease Paternal Grandfather    Mental illness Daughter    Migraines Daughter    Colon cancer Neg Hx    Esophageal cancer Neg Hx    Stomach cancer Neg Hx    Rectal cancer Neg Hx     Social History   Socioeconomic History   Marital status: Married    Spouse name: Not on file   Number of children: 3   Years of education: Not on file   Highest education level: Not on file  Occupational History   Not on file  Tobacco Use   Smoking status: Never   Smokeless tobacco: Never  Vaping Use   Vaping status: Never Used  Substance and Sexual Activity   Alcohol use: Not Currently    Comment: 0-2 drinks a month   Drug use: Never   Sexual activity: Yes    Partners: Male    Birth control/protection: Surgical    Comment: tubal ligation  Other Topics Concern   Not on file  Social History Narrative   Not on file   Social Determinants of Health   Financial Resource Strain: Not on file  Food Insecurity: Not on file  Transportation Needs: Not on file  Physical Activity: Not on file  Stress: Not on file  Social Connections: Not on file  Intimate Partner Violence: Not on file    Physical Exam: Vital signs in last 24 hours: LMP 07/12/2011  GEN: NAD EYE: Sclerae anicteric ENT: MMM CV: Non-tachycardic Pulm: No  increased WOB GI: Soft NEURO:  Alert & Oriented   Eulah Pont, MD [facility]   01/24/2023 8:59 AM

## 2023-01-24 NOTE — Patient Instructions (Addendum)
-   Await pathology results. - Trial of omeprazole 40 mg BID for 4 weeks, followed by QD.  YOU HAD AN ENDOSCOPIC PROCEDURE TODAY AT THE [facility]:   Refer to the procedure report that was given to you for any specific questions about what was found during the examination.  If the procedure report does not answer your questions, please call your gastroenterologist to clarify.  If you requested that your care partner not be given the details of your procedure findings, then the procedure report has been included in a sealed envelope for you to review at your convenience later.  YOU SHOULD EXPECT: Some feelings of bloating in the abdomen. Passage of more gas than usual.  Walking can help get rid of the air that was put into your GI tract during the procedure and reduce the bloating. If you had a lower endoscopy (such as a colonoscopy or flexible sigmoidoscopy) you may notice spotting of blood in your stool or on the toilet paper. If you underwent a bowel prep for your procedure, you may not have a normal bowel movement for a few days.  Please Note:  You might notice some irritation and congestion in your nose or some drainage.  This is from the oxygen used during your procedure.  There is no need for concern and it should clear up in a day or so.  SYMPTOMS TO REPORT IMMEDIATELY:  Following upper endoscopy (EGD)  Vomiting of blood or coffee ground material  New chest pain or pain under the shoulder blades  Painful or persistently difficult swallowing  New shortness of breath  Fever of 100F or higher  Black, tarry-looking stools  For urgent or emergent issues, a gastroenterologist can be reached at any hour by calling [phone]. Do not use MyChart messaging for urgent concerns.    DIET:  We do recommend a small meal at first, but then you may proceed to your regular diet.  Drink plenty of fluids but you should avoid alcoholic beverages for 24 hours.  ACTIVITY:  You should plan  to take it easy for the rest of today and you should NOT DRIVE or use heavy machinery until tomorrow (because of the sedation medicines used during the test).    FOLLOW UP: Our staff will call the number listed on your records the next business day following your procedure.  We will call around 7:15- 8:00 am to check on you and address any questions or concerns that you may have regarding the information given to you following your procedure. If we do not reach you, we will leave a message.     If any biopsies were taken you will be contacted by phone or by letter within the next 1-3 weeks.  Please call us at [phone] if you have not heard about the biopsies in 3 weeks.    SIGNATURES/CONFIDENTIALITY: You and/or your care partner have signed paperwork which will be entered into your electronic medical record.  These signatures attest to the fact that that the information above on your After Visit Summary has been reviewed and is understood.  Full responsibility of the confidentiality of this discharge information lies with you and/or your care-partner.

## 2023-01-25 ENCOUNTER — Telehealth: Payer: Self-pay

## 2023-01-25 NOTE — Telephone Encounter (Signed)
  Follow up Call-     01/24/2023    9:06 AM 01/24/2023    9:00 AM 09/09/2021    7:56 AM  Call back number  Post procedure Call Back phone  # [phone]  Permission to leave phone message Yes Yes Yes     Patient questions:  Do you have a fever, pain , or abdominal swelling? No. Pain Score  0 *  Have you tolerated food without any problems? Yes.    Have you been able to return to your normal activities? Yes.    Do you have any questions about your discharge instructions: Diet   No. Medications  No. Follow up visit  No.  Do you have questions or concerns about your Care? No.  Actions: * If pain score is 4 or above: No action needed, pain <4.

## 2023-01-26 ENCOUNTER — Encounter: Payer: Self-pay | Admitting: Internal Medicine

## 2023-01-31 ENCOUNTER — Encounter: Payer: Self-pay | Admitting: Internal Medicine

## 2023-02-01 NOTE — Telephone Encounter (Signed)
Dr Leonides Schanz-  Patient sent mychart message with several questions. I answered a couple, but the questions below, Id like your input on please...  "I have read a lot about the side effects of long-term use of Omeprezole. Should I be concerned about B12? I have been deficient previously. Should I be concerned about my bones?"

## 2023-02-02 NOTE — Progress Notes (Signed)
Received order for f/u diagnostic breast imaging from the [facility].  Order was signed by provider and faxed successfully back to imaging center @ [phone].

## 2023-03-10 ENCOUNTER — Encounter: Payer: Self-pay | Admitting: *Deleted

## 2023-03-13 ENCOUNTER — Other Ambulatory Visit: Payer: Self-pay | Admitting: Internal Medicine

## 2023-03-13 DIAGNOSIS — R1310 Dysphagia, unspecified: Secondary | ICD-10-CM

## 2023-03-14 ENCOUNTER — Other Ambulatory Visit: Payer: Self-pay | Admitting: Medical Genetics

## 2023-03-14 DIAGNOSIS — Z006 Encounter for examination for normal comparison and control in clinical research program: Secondary | ICD-10-CM

## 2023-03-16 ENCOUNTER — Other Ambulatory Visit: Payer: Self-pay

## 2023-03-16 ENCOUNTER — Encounter: Payer: Self-pay | Admitting: Internal Medicine

## 2023-03-16 DIAGNOSIS — R1310 Dysphagia, unspecified: Secondary | ICD-10-CM

## 2023-05-16 ENCOUNTER — Other Ambulatory Visit (HOSPITAL_COMMUNITY)
Admission: RE | Admit: 2023-05-16 | Discharge: 2023-05-16 | Disposition: A | Discharge status: Home / Self Care | Payer: BC Managed Care – PPO | Source: Ambulatory Visit | Attending: Medical Genetics | Admitting: Medical Genetics

## 2023-05-16 DIAGNOSIS — Z006 Encounter for examination for normal comparison and control in clinical research program: Secondary | ICD-10-CM | POA: Insufficient documentation

## 2023-05-24 LAB — HELIX MOLECULAR SCREEN: Genetic Analysis Overall Interpretation: NEGATIVE

## 2023-05-24 LAB — GENECONNECT MOLECULAR SCREEN

## 2023-11-07 ENCOUNTER — Ambulatory Visit: Admitting: Obstetrics and Gynecology

## 2023-11-07 ENCOUNTER — Other Ambulatory Visit: Payer: Self-pay

## 2023-11-07 DIAGNOSIS — Z79890 Hormone replacement therapy (postmenopausal): Secondary | ICD-10-CM

## 2023-11-07 NOTE — Telephone Encounter (Signed)
 Medication refill request: estradiol   Last AEX:  10/05/22 Next AEX: 12/20/23 Last MMG (if hormonal medication request): 03/15/23 bi-rads 2  Refill authorized: please advise

## 2023-11-08 ENCOUNTER — Other Ambulatory Visit: Payer: Self-pay | Admitting: Obstetrics and Gynecology

## 2023-11-08 DIAGNOSIS — Z79890 Hormone replacement therapy (postmenopausal): Secondary | ICD-10-CM

## 2023-11-08 MED ORDER — ESTRADIOL 0.075 MG/24HR TD PTTW
1.0000 | MEDICATED_PATCH | TRANSDERMAL | 0 refills | Status: DC
Start: 2023-11-09 — End: 2023-11-16

## 2023-11-08 MED ORDER — PROGESTERONE MICRONIZED 100 MG PO CAPS
100.0000 mg | ORAL_CAPSULE | Freq: Every day | ORAL | 0 refills | Status: DC
Start: 2023-11-08 — End: 2023-11-16

## 2023-11-14 ENCOUNTER — Encounter: Payer: Self-pay | Admitting: Obstetrics and Gynecology

## 2023-11-14 DIAGNOSIS — Z79890 Hormone replacement therapy (postmenopausal): Secondary | ICD-10-CM

## 2023-11-15 ENCOUNTER — Other Ambulatory Visit: Payer: Self-pay

## 2023-11-15 NOTE — Telephone Encounter (Signed)
 Patient sent mychart message stating that these two medications went to the incorrect pharmacy. Please resend to local pharmacy: [facility] in [place], [place]

## 2023-11-16 ENCOUNTER — Other Ambulatory Visit: Payer: Self-pay | Admitting: Obstetrics and Gynecology

## 2023-11-16 DIAGNOSIS — Z79890 Hormone replacement therapy (postmenopausal): Secondary | ICD-10-CM

## 2023-11-16 MED ORDER — ESTRADIOL 0.075 MG/24HR TD PTTW
1.0000 | MEDICATED_PATCH | TRANSDERMAL | 0 refills | Status: DC
Start: 2023-11-16 — End: 2023-12-20

## 2023-11-16 MED ORDER — PROGESTERONE MICRONIZED 100 MG PO CAPS: 100.0000 mg | ORAL_CAPSULE | Freq: Every day | ORAL | 0 refills | Status: DC

## 2023-12-20 ENCOUNTER — Ambulatory Visit (INDEPENDENT_AMBULATORY_CARE_PROVIDER_SITE_OTHER): Admitting: Obstetrics and Gynecology

## 2023-12-20 ENCOUNTER — Encounter: Payer: Self-pay | Admitting: Obstetrics and Gynecology

## 2023-12-20 VITALS — BP 112/70 | HR 83 | Temp 97.9°F | Ht 67.25 in | Wt 143.0 lb

## 2023-12-20 DIAGNOSIS — R6882 Decreased libido: Secondary | ICD-10-CM | POA: Insufficient documentation

## 2023-12-20 DIAGNOSIS — Z01419 Encounter for gynecological examination (general) (routine) without abnormal findings: Secondary | ICD-10-CM | POA: Diagnosis not present

## 2023-12-20 DIAGNOSIS — N951 Menopausal and female climacteric states: Secondary | ICD-10-CM | POA: Diagnosis not present

## 2023-12-20 MED ORDER — VEOZAH 45 MG PO TABS: 1.0000 | ORAL_TABLET | Freq: Every day | ORAL | 3 refills | Status: DC

## 2023-12-20 NOTE — Patient Instructions (Signed)

## 2023-12-20 NOTE — Progress Notes (Signed)
 53 y.o. G2P3003 female with H/O endometrial ablation, BTL, CIN 1(2017) here for annual exam. Married. 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 Lives in [place].  Patient's last menstrual period was 07/12/2011.   H/O blood clot in her leg in 1999, postpartum. Treated with AC and told to not take contraception. Had not taken birth control for years until she started HRT in 2021.  2023 GYN note questions superficial vs deep venous thrombosis, however no records for review. She otherwise reports doing well.  Abnormal bleeding: none Pelvic discharge or pain: none Breast mass, nipple discharge or skin changes : none  Sexually active: yes Birth control: BTL Hx of abnl PAP: yes, colpo 2017, 2018 Last PAP:     Component Value Date/Time   DIAGPAP  07/20/2021 1111    - Negative for intraepithelial lesion or malignancy (NILM)   DIAGPAP  04/22/2019 1518    - Negative for Intraepithelial Lesions or Malignancy (NILM)   DIAGPAP - Benign reactive/reparative changes 04/22/2019 1518   HPVHIGH Negative 07/20/2021 1111   HPVHIGH Negative 04/22/2019 1518   ADEQPAP  07/20/2021 1111    Satisfactory for evaluation; transformation zone component PRESENT.   ADEQPAP  04/22/2019 1518    Satisfactory for evaluation; transformation zone component PRESENT.   Last mammogram: 01/27/23 BIRADS 2, density c Last colonoscopy: 09/09/21 q41yr  Exercising: walking and yoga Smoker: no  Flowsheet Row Video Visit from 06/15/2021 in [facility] [place]  PHQ-2 Total Score 0       Flowsheet Row Office Visit from 04/19/2021 in 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    Depression    Frequent headaches    Frequent refractory urinary tract infections    Hay fever    History of chicken pox    Right leg DVT (HCC) 1999   behind right knee   Seasonal allergies    Superficial thrombophlebitis    Post partum   Thyroid  disease    on meds   Past Surgical History:  Procedure Laterality Date   ADENOIDECTOMY  1975   BREAST BIOPSY  1995   BREAST EXCISIONAL BIOPSY     CERVICAL DISCECTOMY  2003   CHOLECYSTECTOMY  2013   COLPOSCOPY     ENDOMETRIAL ABLATION     ESOPHAGOGASTRODUODENOSCOPY  2023   YD   THYROIDECTOMY  2018   TUBAL LIGATION     Current Outpatient Medications on File Prior to Visit  Medication Sig Dispense Refill   albuterol  (VENTOLIN  HFA) 108 (90 Base) MCG/ACT inhaler Inhale 2 puffs into the lungs every 4 (four) hours as needed for shortness of breath. 8 g 2   ARMOUR THYROID   15 MG tablet TAKE 0.5 TABLETS BY MOUTH EVERY DAY FOR 90 DAYS.     buPROPion  (WELLBUTRIN  XL) 300 MG 24 hr tablet Take 1 tablet (300 mg total) by mouth daily. 90 tablet 1   cyclobenzaprine  (FLEXERIL ) 10 MG tablet Take 10 mg by mouth 3 (three) times daily as needed.     EPINEPHrine 0.3 mg/0.3 mL IJ SOAJ injection INJECT 1 AUTO-INJECTOR AS NEEDED     Multiple Vitamin (MULTIVITAMIN) tablet Take 1 tablet by mouth daily.     omeprazole  (PRILOSEC) 40 MG capsule TAKE 1 CAPSULE BY MOUTH EVERY DAY 90 capsule 3   SUMAtriptan  (IMITREX ) 100 MG tablet Take 1 tablet (100 mg total) by mouth daily as needed. 10 tablet 0   thyroid  (ARMOUR) 90 MG tablet Take 90 mg by mouth daily. 120 mg x 5 days in a week and 90 mg the other 2 days     topiramate  (TOPAMAX ) 50 MG tablet Take 50 mg by mouth daily.     UNABLE TO FIND Med Name: naltrexone compounded     No current facility-administered medications  on file prior to visit.   Social History   Socioeconomic History   Marital status: Married    Spouse name: Not on file   Number of children: 3   Years of education: Not on file   Highest education level: Not on file  Occupational History   Not on file  Tobacco Use   Smoking status: Never   Smokeless tobacco: Never  Vaping Use   Vaping status: Never Used  Substance and Sexual Activity   Alcohol use: Not Currently   Drug use: Never   Sexual activity: Yes    Partners: Male    Birth control/protection: Surgical, Post-menopausal    Comment: tubal ligation  Other Topics Concern   Not on file  Social History Narrative   Not on file   Social Drivers of Health   Financial Resource Strain: Not on file  Food Insecurity: Not on file  Transportation Needs: Not on file  Physical Activity: Not on file  Stress: Not on file  Social Connections: Not on file  Intimate Partner Violence: Not on file   Family History  Problem Relation Age of Onset   Depression Mother    Pancreatitis Mother    Alcohol abuse Father    Depression Father    Diabetes Father    Early death Father    Heart attack Father    Heart disease Father    Pancreatitis Father    Depression Sister    Hyperlipidemia Sister    Mental illness Sister    Depression Sister    Depression Sister    Depression Maternal Grandmother    Early death Maternal Grandmother    Hyperlipidemia Maternal Grandmother    Hypertension Maternal Grandmother    Stroke Maternal Grandmother    Learning disabilities Maternal Grandmother    Depression Maternal Grandfather    Early death Maternal Grandfather    Alcohol abuse Maternal Grandfather    Learning disabilities Maternal Grandfather    Epilepsy Maternal Grandfather    Suicidality Maternal Grandfather    Arthritis Paternal Grandmother    Cancer Paternal Grandmother    Hearing loss Paternal Grandmother    Breast cancer Paternal Grandmother    Alcohol abuse Paternal Grandfather     Hearing loss Paternal Grandfather    Heart disease Paternal Grandfather    Mental illness Daughter    Migraines Daughter    Colon cancer Neg Hx  Esophageal cancer Neg Hx    Stomach cancer Neg Hx    Rectal cancer Neg Hx    Allergies  Allergen Reactions   Alpha-Gal    Gluten Meal    Oseltamivir Other (See Comments)    Severe jaw Pain   Other Other (See Comments)    Beef Causes stomach pain   Tamiflu [Oseltamivir Phosphate]    Zofran [Ondansetron]     rash     PE Today's Vitals   12/20/23 1123  BP: 112/70  Pulse: 83  Temp: 97.9 F (36.6 C)  TempSrc: Oral  SpO2: 99%  Weight: 143 lb (64.9 kg)  Height: 5' 7.25 (1.708 m)   Body mass index is 22.23 kg/m.  Physical Exam Vitals reviewed. Exam conducted with a chaperone present.  Constitutional:      General: She is not in acute distress.    Appearance: Normal appearance.  HENT:     Head: Normocephalic and atraumatic.     Nose: Nose normal.  Eyes:     Extraocular Movements: Extraocular movements intact.     Conjunctiva/sclera: Conjunctivae normal.  Neck:     Thyroid : No thyroid  mass, thyromegaly or thyroid  tenderness.  Pulmonary:     Effort: Pulmonary effort is normal.  Chest:     Chest wall: No mass or tenderness.  Breasts:    Right: Normal. No swelling, mass, nipple discharge, skin change or tenderness.     Left: Normal. No swelling, mass, nipple discharge, skin change or tenderness.  Abdominal:     General: There is no distension.     Palpations: Abdomen is soft.     Tenderness: There is no abdominal tenderness.  Genitourinary:    General: Normal vulva.     Exam position: Lithotomy position.     Urethra: No prolapse.     Vagina: Normal. No vaginal discharge or bleeding.     Cervix: Normal. No lesion.     Uterus: Normal. Not enlarged and not tender.      Adnexa: Right adnexa normal and left adnexa normal.  Musculoskeletal:        General: Normal range of motion.     Cervical back: Normal range of  motion.  Lymphadenopathy:     Upper Body:     Right upper body: No axillary adenopathy.     Left upper body: No axillary adenopathy.     Lower Body: No right inguinal adenopathy. No left inguinal adenopathy.  Skin:    General: Skin is warm and dry.  Neurological:     General: No focal deficit present.     Mental Status: She is alert.  Psychiatric:        Mood and Affect: Mood normal.        Behavior: Behavior normal.      Assessment and Plan:        Well woman exam with routine gynecological exam Assessment & Plan: Cervical cancer screening performed according to ASCCP guidelines. Encouraged annual mammogram screening Colonoscopy UTD DXA - not indicated Labs and immunizations with her primary Encouraged safe sexual practices as indicated Encouraged healthy lifestyle practices with diet and exercise For patients under 50-70yo, I recommend 1200mg  calcium daily and 600IU of vitamin D daily. Known hx of MDD, anxiety, on wellbutrin  managed by PCP  Vasomotor symptoms due to menopause -     Testos,Total,Free and SHBG (Female) -     Comprehensive metabolic panel with GFR -     Veozah; Take 1 tablet (45 mg total) by  mouth daily.  Dispense: 90 tablet; Refill: 3 Discussed today that HRT should be discontinued given history of postpartum lower extremity blood clot requiring anticoagulation.  Reviewed with patient that if she can obtain records from imaging or prior OB/GYN to clarify diagnosis then we could consider HRT at that time.  Discussed nonhormonal options for VMS, including SSRIs, veozah, gabapetin. Has tried gabapentin  for sleep before and did not like this medication. On Wellbutrin  for chronic pain. Veozah is contraindicated in patient with liver dysfunction and required q3 months CMP.  Will start with this medication.  Low libido -     Testos,Total,Free and SHBG (Female) On Wellbutrin . Consider intrarosa.  Romaine Closs, MD

## 2023-12-20 NOTE — Assessment & Plan Note (Signed)
 Cervical cancer screening performed according to ASCCP guidelines. Encouraged annual mammogram screening Colonoscopy UTD DXA not indicated Labs and immunizations with her primary Encouraged safe sexual practices as indicated Encouraged healthy lifestyle practices with diet and exercise For patients under 50-52yo, I recommend 1200mg  calcium daily and 600IU of vitamin D daily.

## 2023-12-21 ENCOUNTER — Telehealth: Payer: Self-pay

## 2023-12-21 ENCOUNTER — Ambulatory Visit: Payer: Self-pay | Admitting: Obstetrics and Gynecology

## 2023-12-21 NOTE — Telephone Encounter (Signed)
 A PA has been started for Veozah 45 mg tablets on today.  Key: [facility]

## 2023-12-22 NOTE — Telephone Encounter (Signed)
 Received Approval for Veozah  45 mg tablets today.   Left voice message for patient.

## 2023-12-24 LAB — COMPREHENSIVE METABOLIC PANEL WITH GFR
AG Ratio: 2.1 (calc) (ref 1.0–2.5)
ALT: 10 U/L (ref 6–29)
AST: 12 U/L (ref 10–35)
Albumin: 4.4 g/dL (ref 3.6–5.1)
Alkaline phosphatase (APISO): 58 U/L (ref 37–153)
BUN: 16 mg/dL (ref 7–25)
CO2: 24 mmol/L (ref 20–32)
Calcium: 9.4 mg/dL (ref 8.6–10.4)
Chloride: 104 mmol/L (ref 98–110)
Creat: 0.95 mg/dL (ref 0.50–1.03)
Globulin: 2.1 g/dL (ref 1.9–3.7)
Glucose, Bld: 91 mg/dL (ref 65–99)
Potassium: 4.2 mmol/L (ref 3.5–5.3)
Sodium: 140 mmol/L (ref 135–146)
Total Bilirubin: 0.8 mg/dL (ref 0.2–1.2)
Total Protein: 6.5 g/dL (ref 6.1–8.1)
eGFR: 73 mL/min/{1.73_m2} (ref >=60)

## 2023-12-24 LAB — TESTOS,TOTAL,FREE AND SHBG (FEMALE)
Free Testosterone: 1.5 pg/mL (ref 0.1–6.4)
Sex Hormone Binding: 113 nmol/L (ref 17–124)
Testosterone, Total, LC-MS-MS: 25 ng/dL (ref 2–45)

## 2024-01-22 ENCOUNTER — Encounter: Payer: Self-pay | Admitting: Obstetrics and Gynecology

## 2024-01-31 ENCOUNTER — Encounter: Payer: Self-pay | Admitting: Obstetrics and Gynecology

## 2024-01-31 DIAGNOSIS — Z79890 Hormone replacement therapy (postmenopausal): Secondary | ICD-10-CM

## 2024-02-02 ENCOUNTER — Other Ambulatory Visit: Payer: Self-pay | Admitting: Obstetrics and Gynecology

## 2024-02-02 DIAGNOSIS — Z79890 Hormone replacement therapy (postmenopausal): Secondary | ICD-10-CM

## 2024-02-02 MED ORDER — TESTOSTERONE 12.5 MG/ACT (1%) TD GEL: TRANSDERMAL | 3 refills | Status: DC

## 2024-02-02 MED ORDER — PROGESTERONE MICRONIZED 100 MG PO CAPS: 100.0000 mg | ORAL_CAPSULE | Freq: Every day | ORAL | 3 refills | Status: AC

## 2024-02-02 MED ORDER — ESTRADIOL 0.075 MG/24HR TD PTTW: 1.0000 | MEDICATED_PATCH | TRANSDERMAL | 3 refills | Status: DC

## 2024-02-02 MED ORDER — TESTOSTERONE 12.5 MG/ACT (1%) TD GEL
TRANSDERMAL | 3 refills | Status: DC
Start: 2024-02-02 — End: 2024-02-02

## 2024-02-02 NOTE — Telephone Encounter (Signed)
 Testosterone RX sent on 02/02/24 for Testosterone Gel 1%.   See patient message regarding compounded testosterone and advise on Rx.

## 2024-02-02 NOTE — Telephone Encounter (Signed)
 Spoke with Andrea at [facility],  Verbal order given for:  Testosterone gel (1%): Apply pea-sized amount (0.5g) to skin daily.  Dispense 45 g 0 RF  Read back and confirmed.

## 2024-02-06 ENCOUNTER — Other Ambulatory Visit: Payer: Self-pay | Admitting: Obstetrics and Gynecology

## 2024-02-06 DIAGNOSIS — Z79890 Hormone replacement therapy (postmenopausal): Secondary | ICD-10-CM

## 2024-02-06 NOTE — Telephone Encounter (Signed)
 Med refill request: estradiol  (Vivelle -Dot) patch Last AEX: 12/20/23 GH Next AEX: not yet scheduled Last MMG (if hormonal med) 03/15/23 Refill authorized: Last Rx sent #12 with 3 refills on 02/05/24 GH. Please approve or deny.

## 2024-04-04 ENCOUNTER — Other Ambulatory Visit: Payer: Self-pay | Admitting: Internal Medicine

## 2024-04-04 DIAGNOSIS — R1310 Dysphagia, unspecified: Secondary | ICD-10-CM

## 2024-04-22 ENCOUNTER — Encounter: Payer: Self-pay | Admitting: Obstetrics and Gynecology

## 2024-04-22 NOTE — Telephone Encounter (Signed)
 Last MMG 03/15/23 -f/u to abnormal screening MMG. Dx Left breast and US , Birads 1 neg  Last AEX 12/20/23  Order printed and to Dr. Dallie to sign to be faxed.   Routing FYI.   Encounter closed.

## 2024-04-29 ENCOUNTER — Ambulatory Visit: Admitting: Obstetrics and Gynecology

## 2024-04-29 ENCOUNTER — Encounter: Payer: Self-pay | Admitting: Obstetrics and Gynecology

## 2024-04-29 VITALS — BP 122/72 | HR 79 | Temp 98.0°F | Wt 139.0 lb

## 2024-04-29 DIAGNOSIS — N951 Menopausal and female climacteric states: Secondary | ICD-10-CM

## 2024-04-29 DIAGNOSIS — R6882 Decreased libido: Secondary | ICD-10-CM

## 2024-04-29 DIAGNOSIS — Z79890 Hormone replacement therapy (postmenopausal): Secondary | ICD-10-CM

## 2024-04-29 LAB — COMPREHENSIVE METABOLIC PANEL WITH GFR
AG Ratio: 2.3 (calc) (ref 1.0–2.5)
ALT: 10 U/L (ref 6–29)
AST: 12 U/L (ref 10–35)
Albumin: 4.4 g/dL (ref 3.6–5.1)
Alkaline phosphatase (APISO): 65 U/L (ref 37–153)
BUN: 10 mg/dL (ref 7–25)
CO2: 23 mmol/L (ref 20–32)
Calcium: 8.8 mg/dL (ref 8.6–10.4)
Chloride: 108 mmol/L (ref 98–110)
Creat: 0.91 mg/dL (ref 0.50–1.03)
Globulin: 1.9 g/dL (ref 1.9–3.7)
Glucose, Bld: 88 mg/dL (ref 65–99)
Potassium: 3.9 mmol/L (ref 3.5–5.3)
Sodium: 139 mmol/L (ref 135–146)
Total Bilirubin: 0.4 mg/dL (ref 0.2–1.2)
Total Protein: 6.3 g/dL (ref 6.1–8.1)
eGFR: 76 mL/min/1.73m2 (ref >=60)

## 2024-04-29 NOTE — Progress Notes (Signed)
 53 y.o. G5P3003 female with H/O endometrial ablation, BTL, CIN 1(2017), hypothyroidism here for med f/u. Married. 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 Lives in [place].  Patient's last menstrual period was 07/12/2011.   H/O blood clot in her leg in 1999, postpartum. Treated with AC and told to not take contraception. Had not taken birth control for years until she started HRT in 2021. Records obtained see 01/22/24 note: Confirmed superficial thrombophlebitis  We discussed veozah  at her last appointment, however she did not start this.  She wanted to continue hormone replacement therapy that was previously prescribed by [facility]. Refills for 75mcg estradiol , prometrium  and testosterone  [facility] pharmacy). She has done well on this.  GYN HISTORY: H/O endometrial ablation BTL CIN 1 (2017)  OB History  Gravida Para Term Preterm AB Living  3 3 3   3   SAB IAB Ectopic Multiple Live Births      3    # Outcome Date GA Lbr Len/2nd Weight Sex Type Anes PTL Lv  3 Term      Vag-Spont   LIV  2 Term      Vag-Spont   LIV  1 Term      Vag-Spont   LIV   Past Medical History:  Diagnosis Date   Allergy to alpha-gal    Arthritis    generalized   Asthma    Colon polyps    COVID-19 long hauler    Depression    Frequent headaches    Frequent refractory urinary tract infections    Hay fever    History of chicken pox    Right leg DVT (HCC) 1999   behind right knee   Seasonal allergies    Superficial thrombophlebitis    Post partum   Thyroid  disease    on meds   Past Surgical History:  Procedure Laterality Date   ADENOIDECTOMY  1975   BREAST BIOPSY  1995   BREAST EXCISIONAL BIOPSY     CERVICAL DISCECTOMY  2003   CHOLECYSTECTOMY  2013   COLPOSCOPY     ENDOMETRIAL ABLATION     ESOPHAGOGASTRODUODENOSCOPY  2023   YD   THYROIDECTOMY  2018   TUBAL LIGATION     Current Outpatient Medications on File Prior to Visit  Medication Sig Dispense Refill    albuterol  (VENTOLIN  HFA) 108 (90 Base) MCG/ACT inhaler Inhale 2 puffs into the lungs every 4 (four) hours as needed for shortness of breath. 8 g 2   ARMOUR THYROID  15 MG tablet TAKE 0.5 TABLETS BY MOUTH EVERY DAY FOR 90 DAYS.     buPROPion  (WELLBUTRIN  XL) 300 MG 24 hr tablet Take 1 tablet (300 mg total) by mouth daily. 90 tablet 1   cyclobenzaprine  (FLEXERIL ) 10 MG tablet Take 10 mg by mouth 3 (three) times daily as needed.     EPINEPHrine 0.3 mg/0.3 mL IJ SOAJ injection INJECT 1 AUTO-INJECTOR AS NEEDED     estradiol  (VIVELLE -DOT) 0.075 MG/24HR PLACE 1 PATCH ONTO THE SKIN 2 TIMES A WEEK. 24 patch 3   Multiple Vitamin (MULTIVITAMIN) tablet Take 1 tablet by mouth daily.     omeprazole  (PRILOSEC) 40 MG capsule TAKE 1 CAPSULE BY MOUTH EVERY DAY 90 capsule 3   progesterone  (PROMETRIUM ) 100 MG capsule Take 1 capsule (100 mg total) by mouth daily. 90 capsule 3   SUMAtriptan  (IMITREX ) 100 MG tablet Take 1 tablet (100 mg total) by mouth daily as needed. 10 tablet 0   Testosterone  12.5 MG/ACT (  1%) GEL Testosterone  gel (1%): Apply pea-sized amount (0.5g) to skin daily.  [facility]. 45 g 3   thyroid  (ARMOUR) 90 MG tablet Take 90 mg by mouth daily. 120 mg x 5 days in a week and 90 mg the other 2 days     topiramate  (TOPAMAX ) 50 MG tablet Take 50 mg by mouth daily.     UNABLE TO FIND Med Name: naltrexone compounded     No current facility-administered medications on file prior to visit.    Allergies  Allergen Reactions   Alpha-Gal    Gluten Meal    Oseltamivir Other (See Comments)    Severe jaw Pain   Other Other (See Comments)    Beef Causes stomach pain   Tamiflu [Oseltamivir Phosphate]    Zofran [Ondansetron]     rash    PE Today's Vitals   04/29/24 1615  BP: 122/72  Pulse: 79  Temp: 98 F (36.7 C)  TempSrc: Oral  SpO2: 99%  Weight: 139 lb (63 kg)   Body mass index is 21.61 kg/m.  Physical Exam Vitals reviewed.  Constitutional:      General: She is  not in acute distress.    Appearance: Normal appearance.  HENT:     Head: Normocephalic and atraumatic.     Nose: Nose normal.  Eyes:     Extraocular Movements: Extraocular movements intact.     Conjunctiva/sclera: Conjunctivae normal.  Pulmonary:     Effort: Pulmonary effort is normal.  Musculoskeletal:        General: Normal range of motion.     Cervical back: Normal range of motion.  Neurological:     General: No focal deficit present.     Mental Status: She is alert.  Psychiatric:        Mood and Affect: Mood normal.        Behavior: Behavior normal.      Assessment and Plan:        Hormone replacement therapy (HRT) -     Testos,Total,Free and SHBG (Female)  Vasomotor symptoms due to menopause Assessment & Plan: Doing well on e+p+t Continue RTO for annual, encouraged to schedule MMG  Orders: -     Comprehensive metabolic panel with GFR  Low libido Assessment & Plan: Continue testosterone  therapy, previously prescribed by [facility] Also on Wellbutrin  We will send in lipids from PCP. Check CMP and test today BP normal     Vera LULLA Pa, MD

## 2024-04-29 NOTE — Assessment & Plan Note (Addendum)
 Continue testosterone  therapy, previously prescribed by [facility] Also on Wellbutrin  We will send in lipids from PCP. Check CMP and test today BP normal

## 2024-04-29 NOTE — Assessment & Plan Note (Addendum)
 Doing well on e+p+t Continue RTO for annual, encouraged to schedule MMG

## 2024-05-04 LAB — TESTOS,TOTAL,FREE AND SHBG (FEMALE)
Free Testosterone: 3.7 pg/mL (ref 0.1–6.4)
Sex Hormone Binding: 169 nmol/L — ABNORMAL HIGH (ref 17–124)
Testosterone, Total, LC-MS-MS: 58 ng/dL — ABNORMAL HIGH (ref 2–45)

## 2024-05-07 ENCOUNTER — Other Ambulatory Visit: Payer: Self-pay | Admitting: Obstetrics and Gynecology

## 2024-05-07 MED ORDER — TESTOSTERONE 12.5 MG/ACT (1%) TD GEL: TRANSDERMAL | Status: AC

## 2024-06-15 ENCOUNTER — Other Ambulatory Visit: Payer: Self-pay | Admitting: Obstetrics and Gynecology

## 2024-06-15 DIAGNOSIS — Z79890 Hormone replacement therapy (postmenopausal): Secondary | ICD-10-CM

## 2024-06-18 NOTE — Telephone Encounter (Signed)
 Med refill request:   Estradiol  (VIVELLE -DOT) 0.075 MG/24HR  Start:  02/06/24 Disp:  24 patches Refills:  3  Last OV: 04/29/24 Last AEX: 12/20/23 Next AEX:  Not yet scheduled Last MMG (if hormonal med):  07/29/21 Refill authorized? Please Advise.

## 2024-06-19 ENCOUNTER — Encounter: Payer: Self-pay | Admitting: Obstetrics and Gynecology

## 2024-06-19 ENCOUNTER — Telehealth: Payer: Self-pay | Admitting: *Deleted

## 2024-06-19 DIAGNOSIS — R6882 Decreased libido: Secondary | ICD-10-CM

## 2024-06-19 DIAGNOSIS — N951 Menopausal and female climacteric states: Secondary | ICD-10-CM

## 2024-06-19 NOTE — Telephone Encounter (Signed)
 Dr. Dallie -please review and advise if any additional labs recommended with testosterone . Patient is inquiring about estradiol  labs. Patient is currently scheduled for OV on 12/23.

## 2024-06-19 NOTE — Telephone Encounter (Signed)
 See patient MyChart encounter dated 06/19/24.   Will close this encounter.

## 2024-06-19 NOTE — Telephone Encounter (Signed)
°  °  Signed     Last OV 04/29/24 Next OV 07/02/24 with Dr. Dallie for HRT retesting.        Left message to call [facility] Triage at [phone], option 4. Called to determine what labs patient is requesting. No future orders or labs noted in last OV.  SABRA Brutus Kate LOISE, RN    Creation Time: 06/19/2024 12:51 PM   Signed     ----- Message from Sharlet DEL sent at 06/18/2024 10:58 AM EST ----- Good morning. One of GH patient left voicemail requesting lab work. If someone can put in the lab orders that be great. Please let me know so I can call her back and scheduled her for labs.

## 2024-06-19 NOTE — Telephone Encounter (Signed)
 Last OV 04/29/24 Next OV 07/02/24 with Dr. Dallie for HRT retesting.     Left message to call [facility] Triage at [phone], option 4. Called to determine what labs patient is requesting. No future orders or labs noted in last OV.  Carrie [name]

## 2024-06-19 NOTE — Telephone Encounter (Signed)
-----   Message from Stilwell H sent at 06/18/2024 10:58 AM EST ----- Good morning. One of GH patient left voicemail requesting lab work. If someone can put in the lab orders that be great. Please let me know so I can call her back and scheduled her for labs. Thanks

## 2024-06-24 ENCOUNTER — Other Ambulatory Visit

## 2024-06-24 DIAGNOSIS — R6882 Decreased libido: Secondary | ICD-10-CM

## 2024-06-24 DIAGNOSIS — N951 Menopausal and female climacteric states: Secondary | ICD-10-CM

## 2024-06-24 NOTE — Addendum Note (Signed)
 Addended by: Luree Palla N on: 06/24/2024 04:24 PM   Modules accepted: Orders

## 2024-06-28 LAB — TESTOS,TOTAL,FREE AND SHBG (FEMALE)
Free Testosterone: 2.6 pg/mL (ref 0.1–6.4)
Sex Hormone Binding: 125 nmol/L — ABNORMAL HIGH (ref 17–124)
Testosterone, Total, LC-MS-MS: 43 ng/dL (ref 2–45)

## 2024-07-02 ENCOUNTER — Ambulatory Visit: Admitting: Obstetrics and Gynecology

## 2024-07-02 ENCOUNTER — Ambulatory Visit: Payer: Self-pay | Admitting: Obstetrics and Gynecology

## 2024-07-02 DIAGNOSIS — N951 Menopausal and female climacteric states: Secondary | ICD-10-CM

## 2024-07-02 DIAGNOSIS — Z79890 Hormone replacement therapy (postmenopausal): Secondary | ICD-10-CM

## 2024-07-02 LAB — ESTRADIOL, ULTRA SENS: Estradiol, Ultra Sensitive: 7 pg/mL

## 2024-07-09 MED ORDER — ESTRADIOL 0.1 MG/24HR TD PTTW: 1.0000 | MEDICATED_PATCH | TRANSDERMAL | 0 refills | Status: DC

## 2024-07-23 ENCOUNTER — Ambulatory Visit: Admitting: Obstetrics and Gynecology

## 2024-08-05 ENCOUNTER — Encounter: Payer: Self-pay | Admitting: Obstetrics and Gynecology

## 2024-08-06 ENCOUNTER — Ambulatory Visit: Admitting: Obstetrics and Gynecology

## 2024-08-11 ENCOUNTER — Other Ambulatory Visit: Payer: Self-pay | Admitting: Obstetrics and Gynecology

## 2024-08-11 DIAGNOSIS — Z79890 Hormone replacement therapy (postmenopausal): Secondary | ICD-10-CM

## 2024-08-11 DIAGNOSIS — N951 Menopausal and female climacteric states: Secondary | ICD-10-CM

## 2024-08-13 ENCOUNTER — Encounter: Payer: Self-pay | Admitting: Obstetrics and Gynecology

## 2024-08-13 ENCOUNTER — Ambulatory Visit: Admitting: Obstetrics and Gynecology

## 2024-08-13 DIAGNOSIS — Z79890 Hormone replacement therapy (postmenopausal): Secondary | ICD-10-CM

## 2024-08-13 DIAGNOSIS — N951 Menopausal and female climacteric states: Secondary | ICD-10-CM

## 2024-08-13 MED ORDER — ESTRADIOL 0.1 MG/24HR TD PTTW: 1.0000 | MEDICATED_PATCH | TRANSDERMAL | 3 refills | Status: AC

## 2024-08-13 NOTE — Telephone Encounter (Signed)
 Med refill request:   estradiol  (VIVELLE -DOT) 0.1 MG/24HR patch  Start:  06/29/24 Disp:  8 patch Refills:  0  Last AEX:  12/20/23 Next OV:  08/13/24 (Med check) Next AEX:  Not yet scheduled Last MMG (if hormonal med):  N/A Refill authorized? Please Advise.

## 2024-08-13 NOTE — Telephone Encounter (Signed)
"  Has appt today, will address  "

## 2024-08-14 LAB — COMPREHENSIVE METABOLIC PANEL WITH GFR
AG Ratio: 2.1 (calc) (ref 1.0–2.5)
ALT: 11 U/L (ref 6–29)
AST: 13 U/L (ref 10–35)
Albumin: 4.2 g/dL (ref 3.6–5.1)
Alkaline phosphatase (APISO): 67 U/L (ref 37–153)
BUN: 10 mg/dL (ref 7–25)
CO2: 24 mmol/L (ref 20–32)
Calcium: 8.5 mg/dL — ABNORMAL LOW (ref 8.6–10.4)
Chloride: 109 mmol/L (ref 98–110)
Creat: 0.94 mg/dL (ref 0.50–1.03)
Globulin: 2 g/dL (ref 1.9–3.7)
Glucose, Bld: 102 mg/dL — ABNORMAL HIGH (ref 65–99)
Potassium: 4 mmol/L (ref 3.5–5.3)
Sodium: 140 mmol/L (ref 135–146)
Total Bilirubin: 0.4 mg/dL (ref 0.2–1.2)
Total Protein: 6.2 g/dL (ref 6.1–8.1)
eGFR: 73 mL/min/{1.73_m2}

## 2024-08-14 LAB — CBC
HCT: 39.6 % (ref 35.9–46.0)
Hemoglobin: 13.5 g/dL (ref 11.7–15.5)
MCH: 31.5 pg (ref 27.0–33.0)
MCHC: 34.1 g/dL (ref 31.6–35.4)
MCV: 92.3 fL (ref 81.4–101.7)
MPV: 10.5 fL (ref 7.5–12.5)
Platelets: 302 10*3/uL (ref 140–400)
RBC: 4.29 Million/uL (ref 3.80–5.10)
RDW: 11.6 % (ref 11.0–15.0)
WBC: 5.3 10*3/uL (ref 3.8–10.8)

## 2024-08-14 LAB — LIPID PANEL
Cholesterol: 183 mg/dL
HDL: 52 mg/dL
LDL Cholesterol (Calc): 102 mg/dL — ABNORMAL HIGH
Non-HDL Cholesterol (Calc): 131 mg/dL — ABNORMAL HIGH
Total CHOL/HDL Ratio: 3.5 (calc)
Triglycerides: 171 mg/dL — ABNORMAL HIGH
